# Patient Record
Sex: Female | Born: 1940 | Race: Black or African American | Hispanic: No | Marital: Married | Smoking: Never smoker
Health system: Southern US, Community
[De-identification: ages and names within clinical notes are randomized; demographics above are authoritative.]

## PROBLEM LIST (undated history)

## (undated) DIAGNOSIS — C801 Malignant (primary) neoplasm, unspecified: Secondary | ICD-10-CM

## (undated) DIAGNOSIS — I10 Essential (primary) hypertension: Secondary | ICD-10-CM

## (undated) DIAGNOSIS — M1990 Unspecified osteoarthritis, unspecified site: Secondary | ICD-10-CM

## (undated) DIAGNOSIS — H409 Unspecified glaucoma: Secondary | ICD-10-CM

## (undated) DIAGNOSIS — Z923 Personal history of irradiation: Secondary | ICD-10-CM

## (undated) HISTORY — PX: ENDOMETRIAL BIOPSY: SHX622

## (undated) HISTORY — PX: EYE SURGERY: SHX253

## (undated) HISTORY — DX: Unspecified glaucoma: H40.9

---

## 2004-06-22 ENCOUNTER — Ambulatory Visit: Payer: Self-pay | Admitting: General Practice

## 2005-08-17 ENCOUNTER — Ambulatory Visit: Payer: Self-pay | Admitting: General Practice

## 2006-07-31 ENCOUNTER — Ambulatory Visit (HOSPITAL_COMMUNITY): Admission: RE | Admit: 2006-07-31 | Discharge: 2006-08-01 | Payer: Self-pay | Admitting: Ophthalmology

## 2006-08-20 ENCOUNTER — Ambulatory Visit: Payer: Self-pay | Admitting: Endocrinology

## 2006-09-24 ENCOUNTER — Ambulatory Visit: Payer: Self-pay | Admitting: Gastroenterology

## 2007-09-05 ENCOUNTER — Ambulatory Visit: Payer: Self-pay | Admitting: Obstetrics and Gynecology

## 2008-10-07 ENCOUNTER — Ambulatory Visit: Payer: Self-pay | Admitting: Obstetrics and Gynecology

## 2009-10-14 ENCOUNTER — Ambulatory Visit: Payer: Self-pay | Admitting: Obstetrics and Gynecology

## 2010-06-14 NOTE — Op Note (Signed)
Joanne Collier, Joanne Collier                    ACCOUNT NO.:  [id number]   MEDICAL RECORD NO.:  [id number]          PATIENT TYPE:  AMB   LOCATION:  SDS                          FACILITY:  [facility]   PHYSICIAN:  John D. Ashley Royalty, M.D. 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   PROCEDURE:  1. Pars plana vitrectomy.  2. Membrane peel.  3. Internal limiting membrane peel.  4. Retinal photocoagulation, left eye.   SURGEON:  Beulah Gandy. Ashley Royalty, M.D.   ASSISTANT:  Rosalie Doctor.   ANESTHESIA:  General.   DETAILS:  Usual prep and drape, the indirect ophthalmoscope laser was  moved into place, 536 burns were placed with the indirect ophthalmoscope  laser power 1,000 milliwatts, 1,000 microns each and 0.1 seconds each,  two rows were placed in the far periphery.   The attention was carried to the pars plana area where conjunctival  peritomies were made at 10, 2 and 4 o'clock.  The sclerotomies were made  at the same locations.  The 5-mm infusion port was anchored into place  at 4 o'clock.  The lighted thick endo cutter were placed at 10 and 2  o'clock respectively.  Contact lens ring was anchored into place at 6  and 12 o'clock, Provisc placed on the corneal surface and the flat  contact lens placed.  The pars plana vitrectomy was carried out just  behind the cataract lens.  Some blood and vitreous elements were seen.  These were carefully removed under low suction and rapid cutting.  Some  of the vitreous areas were white and in a sheet.  These were carefully  removed.  Attention was carried to the macular region where the MVR  sharp blade was used to engage and incise the cellular preretinal  membrane and the internal limiting membrane.  The ILM forceps were then  used to grasp the membrane and peel it around the macular region,  removing the entire  membrane in 2 sheets.  These membranes were removed  from the eye.  The vitrectomy was continued out into the far periphery  with the BIOM viewing system.  All vitreous was trimmed down to the  vitreous base for 360 degrees.  A washout procedure was performed.  The  instruments were removed from the eye, 9-0 nylon was used to close the  sclerotomy sites.  They were tested and found to be tight.  The  conjunctiva was closed with wet field cautery.  Polymyxin and gentamicin  were irrigated into [place] space.  Marcaine was injected around the globe  for postop pain.  Decadron 10 mg was injected into the lower  subconjunctival space.  TobraDex ophthalmic ointment, and a patch and  shield were placed.  The closing pressure was 15 with the [place]  tonometer.   COMPLICATIONS:  None.   DURATION:  45 minutes.   The patient was awakened and taken to recovery in satisfactory  condition.  Beulah Gandy. Ashley Royalty, M.D.  Electronically Signed     JDM/MEDQ  D:  07/31/2006  T:  07/31/2006  Job:  [id number]

## 2010-11-01 ENCOUNTER — Encounter (INDEPENDENT_AMBULATORY_CARE_PROVIDER_SITE_OTHER): Payer: Medicare Other | Admitting: Ophthalmology

## 2010-11-01 DIAGNOSIS — H3530 Unspecified macular degeneration: Secondary | ICD-10-CM

## 2010-11-01 DIAGNOSIS — H43819 Vitreous degeneration, unspecified eye: Secondary | ICD-10-CM

## 2010-11-01 DIAGNOSIS — H35379 Puckering of macula, unspecified eye: Secondary | ICD-10-CM

## 2010-11-01 DIAGNOSIS — H35039 Hypertensive retinopathy, unspecified eye: Secondary | ICD-10-CM

## 2010-11-08 ENCOUNTER — Ambulatory Visit: Payer: Self-pay | Admitting: Obstetrics and Gynecology

## 2010-11-15 LAB — BASIC METABOLIC PANEL
BUN: 13
Calcium: 9.5
Creatinine, Ser: 0.72
GFR calc non Af Amer: >60
Glucose, Bld: 102 — ABNORMAL HIGH
Potassium: 4.1

## 2010-11-15 LAB — CBC
HCT: 34.8 — ABNORMAL LOW
Platelets: 207
RDW: 13.4

## 2011-08-01 ENCOUNTER — Encounter (INDEPENDENT_AMBULATORY_CARE_PROVIDER_SITE_OTHER): Payer: Medicare Other | Admitting: Ophthalmology

## 2011-08-01 DIAGNOSIS — H43819 Vitreous degeneration, unspecified eye: Secondary | ICD-10-CM

## 2011-08-01 DIAGNOSIS — H4010X0 Unspecified open-angle glaucoma, stage unspecified: Secondary | ICD-10-CM

## 2011-08-01 DIAGNOSIS — H35039 Hypertensive retinopathy, unspecified eye: Secondary | ICD-10-CM

## 2011-08-01 DIAGNOSIS — I10 Essential (primary) hypertension: Secondary | ICD-10-CM

## 2011-08-01 DIAGNOSIS — H35379 Puckering of macula, unspecified eye: Secondary | ICD-10-CM

## 2011-08-01 DIAGNOSIS — H3530 Unspecified macular degeneration: Secondary | ICD-10-CM

## 2011-11-09 ENCOUNTER — Ambulatory Visit: Payer: Self-pay | Admitting: Obstetrics and Gynecology

## 2012-05-01 ENCOUNTER — Ambulatory Visit (INDEPENDENT_AMBULATORY_CARE_PROVIDER_SITE_OTHER): Payer: Medicare Other | Admitting: Ophthalmology

## 2012-05-01 DIAGNOSIS — H35379 Puckering of macula, unspecified eye: Secondary | ICD-10-CM

## 2012-05-01 DIAGNOSIS — I10 Essential (primary) hypertension: Secondary | ICD-10-CM

## 2012-05-01 DIAGNOSIS — H35359 Cystoid macular degeneration, unspecified eye: Secondary | ICD-10-CM

## 2012-05-01 DIAGNOSIS — H35039 Hypertensive retinopathy, unspecified eye: Secondary | ICD-10-CM

## 2012-06-12 ENCOUNTER — Encounter (INDEPENDENT_AMBULATORY_CARE_PROVIDER_SITE_OTHER): Payer: Medicare Other | Admitting: Ophthalmology

## 2012-06-12 DIAGNOSIS — I10 Essential (primary) hypertension: Secondary | ICD-10-CM

## 2012-06-12 DIAGNOSIS — H35039 Hypertensive retinopathy, unspecified eye: Secondary | ICD-10-CM

## 2012-06-12 DIAGNOSIS — H35359 Cystoid macular degeneration, unspecified eye: Secondary | ICD-10-CM

## 2012-06-12 DIAGNOSIS — H43819 Vitreous degeneration, unspecified eye: Secondary | ICD-10-CM

## 2012-06-12 DIAGNOSIS — H35379 Puckering of macula, unspecified eye: Secondary | ICD-10-CM

## 2012-09-18 ENCOUNTER — Encounter (INDEPENDENT_AMBULATORY_CARE_PROVIDER_SITE_OTHER): Payer: Medicare Other | Admitting: Ophthalmology

## 2012-09-18 DIAGNOSIS — H43819 Vitreous degeneration, unspecified eye: Secondary | ICD-10-CM

## 2012-09-18 DIAGNOSIS — H35379 Puckering of macula, unspecified eye: Secondary | ICD-10-CM

## 2012-09-18 DIAGNOSIS — I10 Essential (primary) hypertension: Secondary | ICD-10-CM

## 2012-09-18 DIAGNOSIS — H35039 Hypertensive retinopathy, unspecified eye: Secondary | ICD-10-CM

## 2012-09-18 DIAGNOSIS — H35359 Cystoid macular degeneration, unspecified eye: Secondary | ICD-10-CM

## 2012-12-19 ENCOUNTER — Ambulatory Visit (INDEPENDENT_AMBULATORY_CARE_PROVIDER_SITE_OTHER): Payer: Medicare Other | Admitting: Ophthalmology

## 2012-12-19 DIAGNOSIS — H35379 Puckering of macula, unspecified eye: Secondary | ICD-10-CM

## 2012-12-19 DIAGNOSIS — H43819 Vitreous degeneration, unspecified eye: Secondary | ICD-10-CM

## 2012-12-19 DIAGNOSIS — H2510 Age-related nuclear cataract, unspecified eye: Secondary | ICD-10-CM

## 2012-12-19 DIAGNOSIS — H35039 Hypertensive retinopathy, unspecified eye: Secondary | ICD-10-CM

## 2012-12-19 DIAGNOSIS — I10 Essential (primary) hypertension: Secondary | ICD-10-CM

## 2012-12-19 DIAGNOSIS — H35359 Cystoid macular degeneration, unspecified eye: Secondary | ICD-10-CM

## 2013-04-21 ENCOUNTER — Ambulatory Visit (INDEPENDENT_AMBULATORY_CARE_PROVIDER_SITE_OTHER): Payer: Medicare Other | Admitting: Ophthalmology

## 2013-04-21 DIAGNOSIS — H35039 Hypertensive retinopathy, unspecified eye: Secondary | ICD-10-CM

## 2013-04-21 DIAGNOSIS — H43819 Vitreous degeneration, unspecified eye: Secondary | ICD-10-CM

## 2013-04-21 DIAGNOSIS — H2510 Age-related nuclear cataract, unspecified eye: Secondary | ICD-10-CM

## 2013-04-21 DIAGNOSIS — I10 Essential (primary) hypertension: Secondary | ICD-10-CM

## 2013-04-21 DIAGNOSIS — H35359 Cystoid macular degeneration, unspecified eye: Secondary | ICD-10-CM

## 2013-04-21 DIAGNOSIS — H35379 Puckering of macula, unspecified eye: Secondary | ICD-10-CM

## 2013-10-22 ENCOUNTER — Ambulatory Visit (INDEPENDENT_AMBULATORY_CARE_PROVIDER_SITE_OTHER): Payer: Medicare Other | Admitting: Ophthalmology

## 2013-10-22 DIAGNOSIS — I10 Essential (primary) hypertension: Secondary | ICD-10-CM

## 2013-10-22 DIAGNOSIS — H43819 Vitreous degeneration, unspecified eye: Secondary | ICD-10-CM | POA: Diagnosis not present

## 2013-10-22 DIAGNOSIS — H35359 Cystoid macular degeneration, unspecified eye: Secondary | ICD-10-CM

## 2013-10-22 DIAGNOSIS — H35039 Hypertensive retinopathy, unspecified eye: Secondary | ICD-10-CM

## 2013-11-28 ENCOUNTER — Ambulatory Visit: Payer: Self-pay | Admitting: Obstetrics and Gynecology

## 2014-04-29 ENCOUNTER — Ambulatory Visit (INDEPENDENT_AMBULATORY_CARE_PROVIDER_SITE_OTHER): Payer: Medicare Other | Admitting: Ophthalmology

## 2014-04-30 ENCOUNTER — Ambulatory Visit (INDEPENDENT_AMBULATORY_CARE_PROVIDER_SITE_OTHER): Payer: Medicare Other | Admitting: Ophthalmology

## 2014-05-06 ENCOUNTER — Ambulatory Visit (INDEPENDENT_AMBULATORY_CARE_PROVIDER_SITE_OTHER): Payer: Medicare Other | Admitting: Ophthalmology

## 2014-05-06 DIAGNOSIS — H59031 Cystoid macular edema following cataract surgery, right eye: Secondary | ICD-10-CM

## 2014-05-06 DIAGNOSIS — H43811 Vitreous degeneration, right eye: Secondary | ICD-10-CM

## 2014-05-06 DIAGNOSIS — H35033 Hypertensive retinopathy, bilateral: Secondary | ICD-10-CM

## 2014-05-06 DIAGNOSIS — I10 Essential (primary) hypertension: Secondary | ICD-10-CM

## 2014-05-06 DIAGNOSIS — H35373 Puckering of macula, bilateral: Secondary | ICD-10-CM | POA: Diagnosis not present

## 2014-05-06 DIAGNOSIS — H2511 Age-related nuclear cataract, right eye: Secondary | ICD-10-CM | POA: Diagnosis not present

## 2014-11-17 ENCOUNTER — Other Ambulatory Visit: Payer: Self-pay | Admitting: Obstetrics and Gynecology

## 2014-11-17 DIAGNOSIS — Z1231 Encounter for screening mammogram for malignant neoplasm of breast: Secondary | ICD-10-CM

## 2014-12-02 ENCOUNTER — Other Ambulatory Visit: Payer: Self-pay | Admitting: Obstetrics and Gynecology

## 2014-12-02 ENCOUNTER — Ambulatory Visit
Admission: RE | Admit: 2014-12-02 | Discharge: 2014-12-02 | Disposition: A | Discharge status: Home / Self Care | Payer: Medicare Other | Source: Ambulatory Visit | Attending: Obstetrics and Gynecology | Admitting: Obstetrics and Gynecology

## 2014-12-02 DIAGNOSIS — Z1231 Encounter for screening mammogram for malignant neoplasm of breast: Secondary | ICD-10-CM

## 2015-02-08 ENCOUNTER — Ambulatory Visit (INDEPENDENT_AMBULATORY_CARE_PROVIDER_SITE_OTHER): Payer: Medicare Other | Admitting: Ophthalmology

## 2015-03-31 ENCOUNTER — Ambulatory Visit (INDEPENDENT_AMBULATORY_CARE_PROVIDER_SITE_OTHER): Payer: Medicare Other | Admitting: Ophthalmology

## 2015-04-12 ENCOUNTER — Ambulatory Visit (INDEPENDENT_AMBULATORY_CARE_PROVIDER_SITE_OTHER): Payer: Medicare Other | Admitting: Ophthalmology

## 2015-04-12 DIAGNOSIS — H35033 Hypertensive retinopathy, bilateral: Secondary | ICD-10-CM | POA: Diagnosis not present

## 2015-04-12 DIAGNOSIS — H2511 Age-related nuclear cataract, right eye: Secondary | ICD-10-CM

## 2015-04-12 DIAGNOSIS — H35373 Puckering of macula, bilateral: Secondary | ICD-10-CM | POA: Diagnosis not present

## 2015-04-12 DIAGNOSIS — H43813 Vitreous degeneration, bilateral: Secondary | ICD-10-CM | POA: Diagnosis not present

## 2015-04-12 DIAGNOSIS — I10 Essential (primary) hypertension: Secondary | ICD-10-CM | POA: Diagnosis not present

## 2015-11-26 ENCOUNTER — Other Ambulatory Visit: Payer: Self-pay | Admitting: Obstetrics and Gynecology

## 2015-11-26 DIAGNOSIS — Z1231 Encounter for screening mammogram for malignant neoplasm of breast: Secondary | ICD-10-CM

## 2015-12-31 ENCOUNTER — Ambulatory Visit
Admission: RE | Admit: 2015-12-31 | Discharge: 2015-12-31 | Disposition: A | Discharge status: Home / Self Care | Payer: Medicare Other | Source: Ambulatory Visit | Attending: Obstetrics and Gynecology | Admitting: Obstetrics and Gynecology

## 2015-12-31 DIAGNOSIS — Z1231 Encounter for screening mammogram for malignant neoplasm of breast: Secondary | ICD-10-CM | POA: Diagnosis present

## 2016-01-12 ENCOUNTER — Ambulatory Visit (INDEPENDENT_AMBULATORY_CARE_PROVIDER_SITE_OTHER): Payer: Medicare Other | Admitting: Ophthalmology

## 2016-01-12 DIAGNOSIS — H43811 Vitreous degeneration, right eye: Secondary | ICD-10-CM | POA: Diagnosis not present

## 2016-01-12 DIAGNOSIS — H35033 Hypertensive retinopathy, bilateral: Secondary | ICD-10-CM

## 2016-01-12 DIAGNOSIS — H35373 Puckering of macula, bilateral: Secondary | ICD-10-CM

## 2016-01-12 DIAGNOSIS — H2511 Age-related nuclear cataract, right eye: Secondary | ICD-10-CM

## 2016-01-12 DIAGNOSIS — I10 Essential (primary) hypertension: Secondary | ICD-10-CM | POA: Diagnosis not present

## 2016-10-13 ENCOUNTER — Ambulatory Visit (INDEPENDENT_AMBULATORY_CARE_PROVIDER_SITE_OTHER): Payer: Medicare Other | Admitting: Ophthalmology

## 2016-11-02 ENCOUNTER — Encounter (INDEPENDENT_AMBULATORY_CARE_PROVIDER_SITE_OTHER): Payer: Medicare Other | Admitting: Ophthalmology

## 2016-11-02 DIAGNOSIS — H35033 Hypertensive retinopathy, bilateral: Secondary | ICD-10-CM

## 2016-11-02 DIAGNOSIS — H35373 Puckering of macula, bilateral: Secondary | ICD-10-CM

## 2016-11-02 DIAGNOSIS — I10 Essential (primary) hypertension: Secondary | ICD-10-CM | POA: Diagnosis not present

## 2016-11-02 DIAGNOSIS — H43811 Vitreous degeneration, right eye: Secondary | ICD-10-CM

## 2016-11-28 ENCOUNTER — Other Ambulatory Visit: Payer: Self-pay | Admitting: Obstetrics and Gynecology

## 2016-11-28 DIAGNOSIS — N8111 Cystocele, midline: Secondary | ICD-10-CM | POA: Insufficient documentation

## 2016-11-28 DIAGNOSIS — Z1231 Encounter for screening mammogram for malignant neoplasm of breast: Secondary | ICD-10-CM

## 2017-01-02 ENCOUNTER — Ambulatory Visit
Admission: RE | Admit: 2017-01-02 | Discharge: 2017-01-02 | Disposition: A | Discharge status: Home / Self Care | Payer: Medicare Other | Source: Ambulatory Visit | Attending: Obstetrics and Gynecology | Admitting: Obstetrics and Gynecology

## 2017-01-02 DIAGNOSIS — Z1231 Encounter for screening mammogram for malignant neoplasm of breast: Secondary | ICD-10-CM | POA: Insufficient documentation

## 2017-01-03 ENCOUNTER — Other Ambulatory Visit: Payer: Self-pay | Admitting: Obstetrics and Gynecology

## 2017-01-03 DIAGNOSIS — R928 Other abnormal and inconclusive findings on diagnostic imaging of breast: Secondary | ICD-10-CM

## 2017-01-03 DIAGNOSIS — N6320 Unspecified lump in the left breast, unspecified quadrant: Secondary | ICD-10-CM

## 2017-01-11 ENCOUNTER — Ambulatory Visit
Admission: RE | Admit: 2017-01-11 | Discharge: 2017-01-11 | Disposition: A | Discharge status: Home / Self Care | Payer: Medicare Other | Source: Ambulatory Visit | Attending: Obstetrics and Gynecology | Admitting: Obstetrics and Gynecology

## 2017-01-11 DIAGNOSIS — N6321 Unspecified lump in the left breast, upper outer quadrant: Secondary | ICD-10-CM | POA: Insufficient documentation

## 2017-01-11 DIAGNOSIS — N6320 Unspecified lump in the left breast, unspecified quadrant: Secondary | ICD-10-CM

## 2017-01-11 DIAGNOSIS — N6322 Unspecified lump in the left breast, upper inner quadrant: Secondary | ICD-10-CM | POA: Insufficient documentation

## 2017-01-11 DIAGNOSIS — R928 Other abnormal and inconclusive findings on diagnostic imaging of breast: Secondary | ICD-10-CM

## 2017-01-15 ENCOUNTER — Other Ambulatory Visit: Payer: Self-pay | Admitting: Obstetrics and Gynecology

## 2017-01-15 DIAGNOSIS — N6320 Unspecified lump in the left breast, unspecified quadrant: Secondary | ICD-10-CM

## 2017-01-15 DIAGNOSIS — R928 Other abnormal and inconclusive findings on diagnostic imaging of breast: Secondary | ICD-10-CM

## 2017-01-30 DIAGNOSIS — C50919 Malignant neoplasm of unspecified site of unspecified female breast: Secondary | ICD-10-CM

## 2017-01-30 HISTORY — DX: Malignant neoplasm of unspecified site of unspecified female breast: C50.919

## 2017-02-01 ENCOUNTER — Ambulatory Visit
Admission: RE | Admit: 2017-02-01 | Discharge: 2017-02-01 | Disposition: A | Discharge status: Home / Self Care | Payer: Medicare Other | Source: Ambulatory Visit | Attending: Obstetrics and Gynecology | Admitting: Obstetrics and Gynecology

## 2017-02-01 DIAGNOSIS — Z170 Estrogen receptor positive status [ER+]: Secondary | ICD-10-CM

## 2017-02-01 DIAGNOSIS — C50412 Malignant neoplasm of upper-outer quadrant of left female breast: Secondary | ICD-10-CM | POA: Diagnosis not present

## 2017-02-01 DIAGNOSIS — R928 Other abnormal and inconclusive findings on diagnostic imaging of breast: Secondary | ICD-10-CM

## 2017-02-01 DIAGNOSIS — C50212 Malignant neoplasm of upper-inner quadrant of left female breast: Secondary | ICD-10-CM | POA: Insufficient documentation

## 2017-02-01 DIAGNOSIS — N6320 Unspecified lump in the left breast, unspecified quadrant: Secondary | ICD-10-CM

## 2017-02-01 HISTORY — PX: BREAST BIOPSY: SHX20

## 2017-02-02 ENCOUNTER — Encounter: Payer: Self-pay | Admitting: *Deleted

## 2017-02-02 NOTE — Progress Notes (Signed)
  Oncology Nurse Navigator Documentation  Navigator Location: [facility] Onc (02/02/17 1500) Referral date to RadOnc/MedOnc: 02/08/17 (02/02/17 1500) )Navigator Encounter Type: Introductory phone call (02/02/17 1500)   Abnormal Finding Date: 01/11/17 (02/02/17 1500) Confirmed Diagnosis Date: 02/02/17 (02/02/17 1500)                   Barriers/Navigation Needs: Education;Coordination of Care (02/02/17 1500)   Interventions: Coordination of Care (02/02/17 1500)   Coordination of Care: Appts (02/02/17 1500)                  Time Spent with Patient: 45 (02/02/17 1500)   Called patient to establish navigation services.  Patient is newly diagnosed with left invasive breast cancer.  Per Dr. Tonette Bihari request patient will be scheduled for surgical consult with Dr. Tamala Julian. His office is closed this afternoon, so I will schedule her appointment on Monday.  Patient does not have a preference as to a Psychologist, sport and exercise or medical oncologist.  I have scheduled her to see Dr. Mike Gip on 01/1017 @ 3:45.  She is to bring a photo ID and all her meds.  She is to call if she has any questions or needs.  I will give her her educational literature at her appointment.

## 2017-02-05 ENCOUNTER — Encounter: Payer: Self-pay | Admitting: *Deleted

## 2017-02-05 NOTE — Progress Notes (Signed)
  Oncology Nurse Navigator Documentation  Navigator Location: [facility] (02/05/17 0800)   )Navigator Encounter Type: Telephone (02/05/17 0800) Telephone: Lahoma Crocker Call (02/05/17 0800)                       Barriers/Navigation Needs: Coordination of Care (02/05/17 0800)   Interventions: Coordination of Care (02/05/17 0800)   Coordination of Care: Appts (02/05/17 0800)                  Time Spent with Patient: 15 (02/05/17 0800)   Called patient and informed her of her appointment with Dr. Tamala Julian on 02/06/17.  Sherry at his office is to give the patient her educational literature, "My Breast Cancer Treatment Handbook" by Josephine Igo, RN.  Will meet patient at her medical oncology consult with Dr. Mike Gip.

## 2017-02-06 ENCOUNTER — Other Ambulatory Visit: Payer: Self-pay | Admitting: Surgery

## 2017-02-06 DIAGNOSIS — C50412 Malignant neoplasm of upper-outer quadrant of left female breast: Secondary | ICD-10-CM

## 2017-02-06 LAB — SURGICAL PATHOLOGY

## 2017-02-07 ENCOUNTER — Other Ambulatory Visit: Payer: Self-pay | Admitting: Surgery

## 2017-02-07 DIAGNOSIS — C50412 Malignant neoplasm of upper-outer quadrant of left female breast: Secondary | ICD-10-CM

## 2017-02-08 ENCOUNTER — Inpatient Hospital Stay: Payer: Medicare Other

## 2017-02-08 ENCOUNTER — Encounter: Payer: Self-pay | Admitting: *Deleted

## 2017-02-08 ENCOUNTER — Other Ambulatory Visit: Payer: Self-pay

## 2017-02-08 ENCOUNTER — Encounter: Payer: Self-pay | Admitting: Hematology and Oncology

## 2017-02-08 ENCOUNTER — Inpatient Hospital Stay: Payer: Medicare Other | Attending: Hematology and Oncology | Admitting: Hematology and Oncology

## 2017-02-08 DIAGNOSIS — Z7982 Long term (current) use of aspirin: Secondary | ICD-10-CM | POA: Diagnosis not present

## 2017-02-08 DIAGNOSIS — Z171 Estrogen receptor negative status [ER-]: Secondary | ICD-10-CM | POA: Insufficient documentation

## 2017-02-08 DIAGNOSIS — C50212 Malignant neoplasm of upper-inner quadrant of left female breast: Secondary | ICD-10-CM

## 2017-02-08 DIAGNOSIS — Z8041 Family history of malignant neoplasm of ovary: Secondary | ICD-10-CM | POA: Diagnosis not present

## 2017-02-08 DIAGNOSIS — H409 Unspecified glaucoma: Secondary | ICD-10-CM | POA: Diagnosis not present

## 2017-02-08 DIAGNOSIS — C50112 Malignant neoplasm of central portion of left female breast: Secondary | ICD-10-CM | POA: Insufficient documentation

## 2017-02-08 DIAGNOSIS — Z800 Family history of malignant neoplasm of digestive organs: Secondary | ICD-10-CM | POA: Diagnosis not present

## 2017-02-08 DIAGNOSIS — Z79899 Other long term (current) drug therapy: Secondary | ICD-10-CM | POA: Diagnosis not present

## 2017-02-08 LAB — COMPREHENSIVE METABOLIC PANEL
ALT: 11 U/L — ABNORMAL LOW (ref 14–54)
AST: 18 U/L (ref 15–41)
Albumin: 3.6 g/dL (ref 3.5–5.0)
Alkaline Phosphatase: 102 U/L (ref 38–126)
Anion gap: 7 (ref 5–15)
BUN: 15 mg/dL (ref 6–20)
CO2: 28 mmol/L (ref 22–32)
Calcium: 9.1 mg/dL (ref 8.9–10.3)
Chloride: 104 mmol/L (ref 101–111)
Creatinine, Ser: 0.72 mg/dL (ref 0.44–1.00)
GFR calc Af Amer: >60 mL/min (ref >60)
GFR calc non Af Amer: >60 mL/min (ref >60)
Glucose, Bld: 97 mg/dL (ref 65–99)
Potassium: 3.7 mmol/L (ref 3.5–5.1)
Sodium: 139 mmol/L (ref 135–145)
Total Bilirubin: 0.5 mg/dL (ref 0.3–1.2)
Total Protein: 8.4 g/dL — ABNORMAL HIGH (ref 6.5–8.1)

## 2017-02-08 LAB — CBC WITH DIFFERENTIAL/PLATELET
Basophils Absolute: 0 10*3/uL (ref 0–0.1)
Basophils Relative: 1 %
Eosinophils Absolute: 0.1 10*3/uL (ref 0–0.7)
Eosinophils Relative: 1 %
HCT: 34.8 % — ABNORMAL LOW (ref 35.0–47.0)
Hemoglobin: 11.6 g/dL — ABNORMAL LOW (ref 12.0–16.0)
Lymphocytes Relative: 22 %
Lymphs Abs: 1.2 10*3/uL (ref 1.0–3.6)
MCH: 30.5 pg (ref 26.0–34.0)
MCHC: 33.4 g/dL (ref 32.0–36.0)
MCV: 91.4 fL (ref 80.0–100.0)
Monocytes Absolute: 0.4 10*3/uL (ref 0.2–0.9)
Monocytes Relative: 7 %
Neutro Abs: 4 10*3/uL (ref 1.4–6.5)
Neutrophils Relative %: 69 %
Platelets: 214 10*3/uL (ref 150–440)
RBC: 3.81 MIL/uL (ref 3.80–5.20)
RDW: 13.7 % (ref 11.5–14.5)
WBC: 5.8 10*3/uL (ref 3.6–11.0)

## 2017-02-08 NOTE — Progress Notes (Signed)
[facility] day:  02/08/2017  Chief Complaint: Joanne Collier is a 77 y.o. female with left breast cancer who is referred in consultation by Dr. Laverta Collier for assessment and management.  HPI:   The patient has annual mammograms.  Bilateral screening mammogram on 01/02/2017 revealed a possible mass in the left breast.  Diagnostic left mammogram and ultrasound on 01/11/2017 revealed an indeterminate solid nodule in the 12 o'clock location of the left breast.  Mammogram revealed a small mass with indistinct margins in the upper central portion of the left breast.  Targeted ultrasound showed a 0.6 x 0.4 x 0.4 cm solid nodule in the 12 o'clock location of the left breast 6 cm from the nipple. Some margins were angular.  There was no associated internal vascularity. There was neither posterior acoustic enhancement or shadowing. Evaluation of the axilla was negative.  Exam revealed no palpable mass.  Left breast biopsy with clip placement on 02/01/2017 revealed invasive mammary carcinoma, no special type.  Biopsy was 4 mm and grade III.  There was no DCIS or lymphovascular invasion.  Tumor was ER- , PR positive (11-50%), and Her2/neu 1+.  She was seen by Dr. Rochel Collier on 02/06/2017.  She is scheduled for partial mastectomy on 02/23/2017.  She has menses at age 71.  She had 4 pregnancies.  She did not breast feed her children.  She was on birth control pills for several years beginning in 1973.  She had menopause in her late 31s.  Her mother had ovarian cancer at age 50.  He brother had colon cancer at age 25.   Symptomatically, she feels good.  She denies any symptoms.   Past Medical History:  Diagnosis Date  . Glaucoma     History reviewed. No pertinent surgical history.  Family History  Problem Relation Age of Onset  . Cancer Mother 23       ovarian  . Cancer Brother 56       colon  . Breast cancer Neg Hx     Social History:  reports  that  has never smoked. she has never used smokeless tobacco. She reports that she does not drink alcohol or use drugs.  She is Lexicographer.  She worked in [employer].  She denies any exposure to radiation or toxins.  She has 3 children ages 73, 42, and 91.  She had an infant who died at 31 months.  She lives in [place].  The patient is accompanied by her husband, Joanne Collier, today.  Allergies: No Known Allergies  Current Medications: Current Outpatient Medications  Medication Sig Dispense Refill  . aspirin EC 81 MG tablet Take 81 mg by mouth daily.    Marland Kitchen atenolol (TENORMIN) 25 MG tablet Take 25 mg by mouth daily.    . dorzolamide (TRUSOPT) 2 % ophthalmic solution Place 1 drop into both eyes 2 (two) times daily.    Marland Kitchen ibuprofen (ADVIL,MOTRIN) 200 MG tablet Take 200 mg by mouth every 6 (six) hours as needed for headache or moderate pain.    . TRAVATAN Z 0.004 % SOLN ophthalmic solution Place 1 drop into the right eye at bedtime.  1   No current facility-administered medications for this visit.     Review of Systems:  GENERAL:  Feels good.  No fevers, sweats or weight loss. PERFORMANCE STATUS (ECOG):  0 HEENT:  s/p left eye surgery (2008) and right cataract surgery (2018).  No visual changes, runny  nose, sore throat, mouth sores or tenderness. Lungs: No shortness of breath or cough.  No hemoptysis. Cardiac:  No chest pain, palpitations, orthopnea, or PND. GI:  No nausea, vomiting, diarrhea, constipation, melena or hematochezia. GU:  No urgency, frequency, dysuria, or hematuria. Musculoskeletal:  No back pain.  No joint pain.  No muscle tenderness. Extremities:  No pain or swelling. Skin:  No rashes or skin changes. Neuro:  No headache, numbness or weakness, balance or coordination issues. Endocrine:  No diabetes, thyroid issues, hot flashes or night sweats. Psych:  No mood changes, depression or anxiety. Pain:  No focal pain. Review of systems:  All other systems reviewed and found to  be negative.  Physical Exam: Blood pressure 126/79, pulse 62, temperature 98 F (36.7 C), temperature source Tympanic, resp. rate 16, height 5' 6" (1.676 m), weight 166 lb 4.8 oz (75.4 kg). GENERAL:  Well developed, well nourished, woman sitting comfortably in the exam room in no acute distress. MENTAL STATUS:  Alert and oriented to person, place and time. HEAD:  Pearline Cables hair.  Normocephalic, atraumatic, face symmetric, no Cushingoid features. EYES:  Glasses.  Brown eyes.  s/p caatract surgery.  Pupils equal round and reactive to light and accomodation.  No conjunctivitis or scleral icterus. ENT:  Oropharynx clear without lesion.  Tongue normal. Mucous membranes moist.  RESPIRATORY:  Clear to auscultation without rales, wheezes or rhonchi. CARDIOVASCULAR:  Regular rate and rhythm without murmur, rub or gallop. BREAST:  Right breast without masses, skin changes or nipple discharge.  Left breast s/p biopsy with an area of ecchymosis and 2-3 cm hematoma superior to nipple.  No skin changes or nipple discharge.  ABDOMEN:  Soft, non-tender, with active bowel sounds, and no hepatosplenomegaly.  No masses. SKIN:  No rashes, ulcers or lesions. EXTREMITIES: No edema, no skin discoloration or tenderness.  No palpable cords. LYMPH NODES: No palpable cervical, supraclavicular, axillary or inguinal adenopathy  NEUROLOGICAL: Unremarkable. PSYCH:  Appropriate.   No visits with results within 3 Day(s) from this visit.  Latest known visit with results is:  Hospital Outpatient Visit on 02/01/2017  Component Date Value Ref Range Status  . SURGICAL PATHOLOGY 02/01/2017    Final-Edited                   Value:Surgical Pathology THIS IS AN ADDENDUM REPORT CASE: [id number] PATIENT: Joanne Collier Surgical Pathology Report Addendum  Reason for Addendum #1:  Immunohistochemistry results  SPECIMEN SUBMITTED: A. Breast, left, 12:00  CLINICAL HISTORY: Small solid nodule in the 12 o'clock location of the left  breast 6 cm from the nipple which measures 0.6 x 0.4 x 0.4 cm  PRE-OPERATIVE DIAGNOSIS: Concern for malignancy  POST-OPERATIVE DIAGNOSIS: Same as pre-op     DIAGNOSIS: A. BREAST, LEFT 12:00; ULTRASOUND GUIDED BIOPSY: - INVASIVE MAMMARY CARCINOMA, NO SPECIAL TYPE.  Size of invasive carcinoma: 4 mm in this sample Histologic grade of invasive carcinoma: overall grade 3      Glandular/tubular differentiation score: 3      Nuclear pleomorphism score: 3      Mitotic rate score: 3      Total score: 3  Ductal carcinoma in situ: Not identified Lymphovascular invasion: Not identified ER/PR/HER2: Immunohistochemistry will be performed on b                         lock A1, with reflex to [place] for HER2 2+. The results will be reported in an addendum.  Comment: The  definitive grade will be assigned on the excisional specimen. These findings were communicated to Curtice in Dr. Terrilyn Saver office on 02/02/2017. Read back procedure was performed.     GROSS DESCRIPTION:  The specimen is received in a formalin-filled container labeled with the patient's name and ultrasound-guided left breast biopsy 12:00, 6 cm from nipple.  Core pieces: 4 Measurement: 1.1-1.9 cm in length and 0.2 cm in diameter Comments: yellow-red lobulated fibrofatty, marked blue  Entirely submitted in cassette(s): 1  Time/Date in fixative: collected and placed in formalin at 8:42 AM on 02/01/2017 Total fixation time: 9 hours    Final Diagnosis performed by Quay Burow, MD.  Electronically signed 02/02/2017 11:13:04AM   The electronic signature indicates that the named Attending Pathologist has evaluated the specimen  Technical component performed at [facility], [address], [place], [place] [address] Lab: [phone] Dir: Rush Farmer, MD, MMM  Professional component performed at [facility], [facility], [place], [place], [place] [address] Lab:  [phone] Dir: Dellia Nims. Rubinas, MD   Breast Biomarker Reporting Template  BREAST BIOMARKER TESTS Estrogen Receptor (ER) Status: Negative Background internal control stains as expected Progesterone Receptor (PgR) Status: Positive, range 11-50% of nuclei      Average intensity of staining: Moderate HER2 (by immunohistochemistry): Negative (1+)  METHODS Cold Ischemia and Fixation Times: Meet requirements specified in latest version of the ASCO/CAP guidelines Testing Performed on Block Number(s): A1 Fixative: Formalin Estrogen Receptor:  FDA cleared ([place])                    Primary Antibody:  SP1 Progesterone Receptor: FDA cleared ([place])                   Primary Antibody: 1E2 HER2 (by immunohistochemistry): FDA approved ([place])                                                      Primary Antibody: HercepTest  Immunohistochemistry controls worked appropriately.  Immunohistochemistry controls worked appropriately. Slides were prepared by [facility], [place], [place], and interpreted by [facility] value].      Addendum #1 performed by Quay Burow, MD.  Electronically signed 02/06/2017 1:50:03PM    Technical component performed at [facility], [address], [place], [place] [address] Lab: [phone] Dir: Rush Farmer, MD, MMM  Professional component performed at [facility], [place], [place], [place], [place] [address] Lab: [phone] Dir: Dellia Nims. Reuel Derby, MD      Assessment:  Joanne Collier is a 77 y.o. female with clinical stage IB left breast cancer s/p biopsy on 02/01/2017.  Pathology revealed invasive mammary carcinoma, no special type.  Biopsy was 4 mm and grade III.  There was no DCIS or lymphovascular invasion.  Tumor was ER- , PR positive (11-50%), and Her2/neu 1+.  CA27.29 was 15.2 on 02/08/2017.   Diagnostic left mammogram and ultrasound on 01/11/2017 revealed an indeterminate solid nodule in the  12 o'clock location of the left breast.  Mammogram revealed a small mass with indistinct margins in the upper central portion of the left breast.  Targeted ultrasound showed a 0.6 x 0.4 x 0.4 cm solid nodule  in the 12 o'clock location of the left breast 6 cm from the nipple. Some margins were angular.  There was no associated internal vascularity. There was neither posterior acoustic enhancement or shadowing.  Evaluation of the axilla was negative.  Exam revealed no palpable mass.  Symptomatically, she feels well.  Exam reveals post biopsy changes in the left breast.  Plan: 1.  Discuss staging and treatment of breast cancer.  Patient has clinical stage IB.  Tumor is ER-, PR+, and Her2/neu -.  Discuss planned surgery and final staging based on pathology.  Discuss treatment individualized in patients > 92 years old.  Although she has PR+ disease, testing is 11-50% and she is ER is negative.  She does not classically have triple negative disease, but her tumor may be more aggressive.  Chemotherapy would be considered if final pathology reveals a larger lesion or she has lymph node positive disease.  Discuss follow-up after surgery.  Discuss plan for radiation after surgery as she is scheduled to undergo lumpectomy.  Discuss plan for hormonal therapy.  Discuss tamoxifen versus aromatase inhibitors.  Side effects reviewed.   2.  Labs today:  CBC with diff, CMP, CA27.29. 3.  Bone density study. 4.  RTC 2 weeks after surgery.  Patient to contact clinic.   Lequita Asal, MD  02/08/2017, 11:25 AM

## 2017-02-08 NOTE — Progress Notes (Signed)
See follow up. 

## 2017-02-09 LAB — CANCER ANTIGEN 27.29: CA 27.29: 15.2 U/mL (ref 0.0–38.6)

## 2017-02-09 NOTE — Progress Notes (Signed)
Oncology Nurse Navigator Documentation    Oncology Nurse Navigator Documentation  Navigator Location: [facility] Onc (02/09/17 1200)   )Navigator Encounter Type: Initial MedOnc (02/09/17 1200)                       Treatment Phase: Pre-Tx/Tx Discussion (02/09/17 1200) Barriers/Navigation Needs: Education (02/09/17 1200) Education: Newly Diagnosed Cancer Education (02/09/17 1200) Interventions: Education (02/09/17 1200)     Education Method: Verbal;Written (02/09/17 1200)                Time Spent with Patient: 75 (02/09/17 1200)   Met patient and her husband during her initial medical oncology visit with Dr. Mike Gip.  Patient is planning lumpectomy on 02/23/17 with Dr. Tamala Julian.  She is to follow-up with Dr. Mike Gip on 03/09/17 for final pathology and treatment planning.  Probably radiation and antihormonal therapy.  She is to call if she has any questions or needs.

## 2017-02-11 ENCOUNTER — Encounter: Payer: Self-pay | Admitting: Hematology and Oncology

## 2017-02-14 ENCOUNTER — Inpatient Hospital Stay: Admission: RE | Admit: 2017-02-14 | Payer: Medicare Other | Source: Ambulatory Visit

## 2017-02-15 ENCOUNTER — Encounter
Admission: RE | Admit: 2017-02-15 | Discharge: 2017-02-15 | Disposition: A | Discharge status: Home / Self Care | Payer: Medicare Other | Source: Ambulatory Visit | Attending: Surgery | Admitting: Surgery

## 2017-02-15 ENCOUNTER — Other Ambulatory Visit: Payer: Self-pay

## 2017-02-15 HISTORY — DX: Malignant (primary) neoplasm, unspecified: C80.1

## 2017-02-15 HISTORY — DX: Unspecified osteoarthritis, unspecified site: M19.90

## 2017-02-15 HISTORY — DX: Essential (primary) hypertension: I10

## 2017-02-15 NOTE — Patient Instructions (Signed)
  Your procedure is scheduled on: 02-23-17 FRIDAY Report to [place] @ 8:15 AM  Remember: Instructions that are not followed completely may result in serious medical risk, up to and including death, or upon the discretion of your surgeon and anesthesiologist your surgery may need to be rescheduled.    _x___ 1. Do not eat food after midnight the night before your procedure. NO GUM OR CANDY AFTER MIDNIGHT.  You may drink clear liquids up to 2 hours before you are scheduled to arrive at the hospital for your procedure.  Do not drink clear liquids within 2 hours of your scheduled arrival to the hospital.  Clear liquids include  --Water or Apple juice without pulp  --Clear carbohydrate beverage such as ClearFast or Gatorade  --Black Coffee or Clear Tea (No milk, no creamers, do not add anything to the coffee or Tea    __x__ 2. No Alcohol for 24 hours before or after surgery.   __x__3. No Smoking for 24 prior to surgery.   ____  4. Bring all medications with you on the day of surgery if instructed.    __x__ 5. Notify your doctor if there is any change in your medical condition     (cold, fever, infections).     Do not wear jewelry, make-up, hairpins, clips or nail polish.  Do not wear lotions, powders, or perfumes. You may wear deodorant.  Do not shave 48 hours prior to surgery. Men may shave face and neck.  Do not bring valuables to the hospital.    [facility] is not responsible for any belongings or valuables.               Contacts, dentures or bridgework may not be worn into surgery.  Leave your suitcase in the car. After surgery it may be brought to your room.  For patients admitted to the hospital, discharge time is determined by your treatment team.   Patients discharged the day of surgery will not be allowed to drive home.  You will need someone to drive you home and stay with you the night of your procedure.    Please read over the following fact sheets that you  were given:   [facility] Preparing for Surgery and or MRSA Information   _x___ TAKE THE FOLLOWING MEDICATION THE MORNING OF SURGERY WITH A SMALL SIP OF WATER. These include:  1. ATENOLOL  2.  3.  4.  5.  6.  ____Fleets enema or Magnesium Citrate as directed.   _x___ Use CHG Soap or sage wipes as directed on instruction sheet   ____ Use inhalers on the day of surgery and bring to hospital day of surgery  ____ Stop Metformin and Janumet 2 days prior to surgery.    ____ Take 1/2 of usual insulin dose the night before surgery and none on the morning surgery.   _x___ Follow recommendations from Cardiologist, Pulmonologist or PCP regarding stopping Aspirin, Coumadin, Plavix ,Eliquis, Effient, or Pradaxa, and Pletal-STOP ASPIRIN NOW PER DR SMITH'S INSTRUCTIONS  X____Stop Anti-inflammatories such as Advil, Aleve, Ibuprofen, Motrin, Naproxen, Naprosyn, Goodies powders or aspirin products 48 HOURS PRIOR TO SURGERY-OK to take Tylenol    ____ Stop supplements until after surgery.     ____ Bring C-Pap to the hospital.

## 2017-02-20 ENCOUNTER — Encounter
Admission: RE | Admit: 2017-02-20 | Discharge: 2017-02-20 | Disposition: A | Discharge status: Home / Self Care | Payer: Medicare Other | Source: Ambulatory Visit | Attending: Surgery | Admitting: Surgery

## 2017-02-20 DIAGNOSIS — Z171 Estrogen receptor negative status [ER-]: Secondary | ICD-10-CM | POA: Diagnosis not present

## 2017-02-20 DIAGNOSIS — Z79899 Other long term (current) drug therapy: Secondary | ICD-10-CM | POA: Diagnosis not present

## 2017-02-20 DIAGNOSIS — I10 Essential (primary) hypertension: Secondary | ICD-10-CM | POA: Diagnosis not present

## 2017-02-20 DIAGNOSIS — C50812 Malignant neoplasm of overlapping sites of left female breast: Secondary | ICD-10-CM | POA: Diagnosis not present

## 2017-02-20 DIAGNOSIS — H409 Unspecified glaucoma: Secondary | ICD-10-CM | POA: Diagnosis not present

## 2017-02-20 DIAGNOSIS — Z8041 Family history of malignant neoplasm of ovary: Secondary | ICD-10-CM | POA: Diagnosis not present

## 2017-02-20 DIAGNOSIS — Z7982 Long term (current) use of aspirin: Secondary | ICD-10-CM | POA: Diagnosis not present

## 2017-02-20 DIAGNOSIS — Z7901 Long term (current) use of anticoagulants: Secondary | ICD-10-CM | POA: Diagnosis not present

## 2017-02-20 DIAGNOSIS — Z808 Family history of malignant neoplasm of other organs or systems: Secondary | ICD-10-CM | POA: Diagnosis not present

## 2017-02-20 DIAGNOSIS — Z800 Family history of malignant neoplasm of digestive organs: Secondary | ICD-10-CM | POA: Diagnosis not present

## 2017-02-20 DIAGNOSIS — C50912 Malignant neoplasm of unspecified site of left female breast: Secondary | ICD-10-CM | POA: Diagnosis present

## 2017-02-22 ENCOUNTER — Other Ambulatory Visit: Payer: Self-pay | Admitting: *Deleted

## 2017-02-23 ENCOUNTER — Ambulatory Visit
Admission: RE | Admit: 2017-02-23 | Discharge: 2017-02-23 | Disposition: A | Discharge status: Home / Self Care | Payer: Medicare Other | Source: Ambulatory Visit | Attending: Surgery | Admitting: Surgery

## 2017-02-23 ENCOUNTER — Ambulatory Visit: Payer: Medicare Other | Admitting: Anesthesiology

## 2017-02-23 ENCOUNTER — Encounter
Admission: RE | Admit: 2017-02-23 | Discharge: 2017-02-23 | Disposition: A | Discharge status: Home / Self Care | Payer: Medicare Other | Source: Ambulatory Visit | Attending: Surgery | Admitting: Surgery

## 2017-02-23 ENCOUNTER — Encounter
Admission: RE | Disposition: A | Discharge status: Home / Self Care | Payer: Self-pay | Source: Ambulatory Visit | Attending: Surgery

## 2017-02-23 ENCOUNTER — Other Ambulatory Visit: Payer: Self-pay

## 2017-02-23 DIAGNOSIS — Z8041 Family history of malignant neoplasm of ovary: Secondary | ICD-10-CM | POA: Insufficient documentation

## 2017-02-23 DIAGNOSIS — C50412 Malignant neoplasm of upper-outer quadrant of left female breast: Secondary | ICD-10-CM

## 2017-02-23 DIAGNOSIS — Z800 Family history of malignant neoplasm of digestive organs: Secondary | ICD-10-CM | POA: Insufficient documentation

## 2017-02-23 DIAGNOSIS — Z7982 Long term (current) use of aspirin: Secondary | ICD-10-CM | POA: Insufficient documentation

## 2017-02-23 DIAGNOSIS — C50812 Malignant neoplasm of overlapping sites of left female breast: Secondary | ICD-10-CM | POA: Diagnosis not present

## 2017-02-23 DIAGNOSIS — Z808 Family history of malignant neoplasm of other organs or systems: Secondary | ICD-10-CM | POA: Insufficient documentation

## 2017-02-23 DIAGNOSIS — Z7901 Long term (current) use of anticoagulants: Secondary | ICD-10-CM | POA: Insufficient documentation

## 2017-02-23 DIAGNOSIS — Z79899 Other long term (current) drug therapy: Secondary | ICD-10-CM | POA: Insufficient documentation

## 2017-02-23 DIAGNOSIS — Z171 Estrogen receptor negative status [ER-]: Secondary | ICD-10-CM | POA: Insufficient documentation

## 2017-02-23 DIAGNOSIS — I10 Essential (primary) hypertension: Secondary | ICD-10-CM | POA: Insufficient documentation

## 2017-02-23 DIAGNOSIS — H409 Unspecified glaucoma: Secondary | ICD-10-CM | POA: Insufficient documentation

## 2017-02-23 HISTORY — PX: BREAST LUMPECTOMY: SHX2

## 2017-02-23 HISTORY — PX: SENTINEL NODE BIOPSY: SHX6608

## 2017-02-23 HISTORY — PX: PARTIAL MASTECTOMY WITH NEEDLE LOCALIZATION: SHX6008

## 2017-02-23 HISTORY — PX: BREAST EXCISIONAL BIOPSY: SUR124

## 2017-02-23 SURGERY — PARTIAL MASTECTOMY WITH NEEDLE LOCALIZATION
Anesthesia: General | Site: Breast | Laterality: Left | Wound class: Clean

## 2017-02-23 MED ORDER — BUPIVACAINE-EPINEPHRINE (PF) 0.5% -1:200000 IJ SOLN
INTRAMUSCULAR | Status: AC
  Filled 2017-02-23: qty 30

## 2017-02-23 MED ORDER — GLYCOPYRROLATE 0.2 MG/ML IJ SOLN
INTRAMUSCULAR | Status: DC | PRN
  Administered 2017-02-23: 0.2 mg via INTRAVENOUS

## 2017-02-23 MED ORDER — PROPOFOL 500 MG/50ML IV EMUL
INTRAVENOUS | Status: AC
Start: 2017-02-23 — End: 2017-02-23
  Filled 2017-02-23: qty 50

## 2017-02-23 MED ORDER — TECHNETIUM TC 99M SULFUR COLLOID: 0.6150 | Freq: Once | INTRAVENOUS | Status: DC | PRN

## 2017-02-23 MED ORDER — DEXAMETHASONE SODIUM PHOSPHATE 10 MG/ML IJ SOLN
INTRAMUSCULAR | Status: DC | PRN
  Administered 2017-02-23: 5 mg via INTRAVENOUS

## 2017-02-23 MED ORDER — METHYLENE BLUE 1 % INJ SOLN
INTRAMUSCULAR | Status: AC
  Filled 2017-02-23: qty 10

## 2017-02-23 MED ORDER — FAMOTIDINE 20 MG PO TABS
ORAL_TABLET | ORAL | Status: AC
  Administered 2017-02-23: 20 mg via ORAL
  Filled 2017-02-23: qty 1

## 2017-02-23 MED ORDER — BUPIVACAINE-EPINEPHRINE 0.5% -1:200000 IJ SOLN
INTRAMUSCULAR | Status: DC | PRN
  Administered 2017-02-23: 15 mL

## 2017-02-23 MED ORDER — DEXAMETHASONE SODIUM PHOSPHATE 10 MG/ML IJ SOLN
INTRAMUSCULAR | Status: AC
Start: 2017-02-23 — End: 2017-02-23
  Filled 2017-02-23: qty 1

## 2017-02-23 MED ORDER — OXYCODONE HCL 5 MG/5ML PO SOLN: 5.0000 mg | Freq: Once | ORAL | Status: DC | PRN

## 2017-02-23 MED ORDER — FAMOTIDINE 20 MG PO TABS
20.0000 mg | ORAL_TABLET | Freq: Once | ORAL | Status: AC
  Administered 2017-02-23: 20 mg via ORAL

## 2017-02-23 MED ORDER — FENTANYL CITRATE (PF) 100 MCG/2ML IJ SOLN
INTRAMUSCULAR | Status: DC | PRN
  Administered 2017-02-23 (×4): 25 ug via INTRAVENOUS
  Administered 2017-02-23: 50 ug via INTRAVENOUS

## 2017-02-23 MED ORDER — EPHEDRINE SULFATE 50 MG/ML IJ SOLN
INTRAMUSCULAR | Status: DC | PRN
  Administered 2017-02-23 (×2): 10 mg via INTRAVENOUS
  Administered 2017-02-23: 5 mg via INTRAVENOUS

## 2017-02-23 MED ORDER — MEPERIDINE HCL 50 MG/ML IJ SOLN: 6.2500 mg | INTRAMUSCULAR | Status: DC | PRN

## 2017-02-23 MED ORDER — PROPOFOL 10 MG/ML IV BOLUS
INTRAVENOUS | Status: DC | PRN
  Administered 2017-02-23: 50 mg via INTRAVENOUS
  Administered 2017-02-23: 100 mg via INTRAVENOUS

## 2017-02-23 MED ORDER — OXYCODONE HCL 5 MG PO TABS: 5.0000 mg | ORAL_TABLET | Freq: Once | ORAL | Status: DC | PRN

## 2017-02-23 MED ORDER — LACTATED RINGERS IV SOLN
INTRAVENOUS | Status: DC
  Administered 2017-02-23: 11:00:00 via INTRAVENOUS

## 2017-02-23 MED ORDER — LIDOCAINE HCL (PF) 2 % IJ SOLN
INTRAMUSCULAR | Status: AC
  Filled 2017-02-23: qty 10

## 2017-02-23 MED ORDER — FENTANYL CITRATE (PF) 100 MCG/2ML IJ SOLN: 25.0000 ug | INTRAMUSCULAR | Status: DC | PRN

## 2017-02-23 MED ORDER — PROMETHAZINE HCL 25 MG/ML IJ SOLN: 6.2500 mg | INTRAMUSCULAR | Status: DC | PRN

## 2017-02-23 MED ORDER — FENTANYL CITRATE (PF) 250 MCG/5ML IJ SOLN
INTRAMUSCULAR | Status: AC
  Filled 2017-02-23: qty 5

## 2017-02-23 MED ORDER — HYDROCODONE-ACETAMINOPHEN 5-325 MG PO TABS: 1.0000 | ORAL_TABLET | Freq: Four times a day (QID) | ORAL | 0 refills | Status: DC | PRN

## 2017-02-23 MED ORDER — LIDOCAINE 2% (20 MG/ML) 5 ML SYRINGE
INTRAMUSCULAR | Status: DC | PRN
  Administered 2017-02-23: 60 mg via INTRAVENOUS

## 2017-02-23 MED ORDER — ONDANSETRON HCL 4 MG/2ML IJ SOLN
INTRAMUSCULAR | Status: AC
  Filled 2017-02-23: qty 2

## 2017-02-23 MED ORDER — TECHNETIUM TC 99M SULFUR COLLOID FILTERED
0.6150 | Freq: Once | INTRAVENOUS | Status: AC | PRN
  Administered 2017-02-23: 0.615 via INTRADERMAL

## 2017-02-23 MED ORDER — ONDANSETRON HCL 4 MG/2ML IJ SOLN
INTRAMUSCULAR | Status: DC | PRN
  Administered 2017-02-23: 4 mg via INTRAVENOUS

## 2017-02-23 SURGICAL SUPPLY — 35 items
BLADE SURG 15 STRL LF DISP TIS (BLADE) ×2 IMPLANT
BLADE SURG 15 STRL SS (BLADE) ×2
CANISTER SUCT 1200ML W/VALVE (MISCELLANEOUS) ×4 IMPLANT
CHLORAPREP W/TINT 26ML (MISCELLANEOUS) ×4 IMPLANT
CNTNR SPEC 2.5X3XGRAD LEK (MISCELLANEOUS) ×2
CONT SPEC 4OZ STER OR WHT (MISCELLANEOUS) ×2
CONTAINER SPEC 2.5X3XGRAD LEK (MISCELLANEOUS) ×2 IMPLANT
DERMABOND ADVANCED (GAUZE/BANDAGES/DRESSINGS) ×2
DERMABOND ADVANCED .7 DNX12 (GAUZE/BANDAGES/DRESSINGS) ×2 IMPLANT
DEVICE DUBIN SPECIMEN MAMMOGRA (MISCELLANEOUS) ×4 IMPLANT
DRAPE LAPAROTOMY 77X122 PED (DRAPES) ×4 IMPLANT
ELECT REM PT RETURN 9FT ADLT (ELECTROSURGICAL) ×4
ELECTRODE REM PT RTRN 9FT ADLT (ELECTROSURGICAL) ×2 IMPLANT
GLOVE BIO SURGEON STRL SZ7.5 (GLOVE) ×4 IMPLANT
GOWN STRL REUS W/ TWL LRG LVL3 (GOWN DISPOSABLE) ×6 IMPLANT
GOWN STRL REUS W/TWL LRG LVL3 (GOWN DISPOSABLE) ×6
IV SODIUM CHL 0.9% 500ML (IV SOLUTION) ×4 IMPLANT
KIT RM TURNOVER STRD PROC AR (KITS) ×4 IMPLANT
LABEL OR SOLS (LABEL) IMPLANT
MARGIN MAP 10MM (MISCELLANEOUS) ×4 IMPLANT
NDL SAFETY ECLIPSE 18X1.5 (NEEDLE) IMPLANT
NEEDLE HYPO 18GX1.5 SHARP (NEEDLE)
NEEDLE HYPO 22GX1.5 SAFETY (NEEDLE) IMPLANT
NEEDLE HYPO 25X1 1.5 SAFETY (NEEDLE) ×4 IMPLANT
PACK BASIN MINOR ARMC (MISCELLANEOUS) ×4 IMPLANT
SLEVE PROBE SENORX GAMMA FIND (MISCELLANEOUS) ×4 IMPLANT
SUT CHROMIC 3 0 SH 27 (SUTURE) IMPLANT
SUT CHROMIC 4 0 RB 1X27 (SUTURE) ×8 IMPLANT
SUT ETHILON 3-0 FS-10 30 BLK (SUTURE) ×4
SUT MNCRL 4-0 (SUTURE) ×4
SUT MNCRL 4-0 27XMFL (SUTURE) ×4
SUTURE EHLN 3-0 FS-10 30 BLK (SUTURE) ×2 IMPLANT
SUTURE MNCRL 4-0 27XMF (SUTURE) ×4 IMPLANT
SYR 10ML LL (SYRINGE) ×4 IMPLANT
WATER STERILE IRR 1000ML POUR (IV SOLUTION) IMPLANT

## 2017-02-23 NOTE — Anesthesia Postprocedure Evaluation (Signed)
Anesthesia Post Note  Patient: Joanne Collier  Procedure(s) Performed: PARTIAL MASTECTOMY WITH NEEDLE LOCALIZATION (Left Breast) SENTINEL NODE BIOPSY (Left Axilla)  Patient location during evaluation: PACU Anesthesia Type: General Level of consciousness: awake and alert and oriented Pain management: pain level controlled Vital Signs Assessment: post-procedure vital signs reviewed and stable Respiratory status: spontaneous breathing, nonlabored ventilation and respiratory function stable Cardiovascular status: blood pressure returned to baseline and stable Postop Assessment: no signs of nausea or vomiting Anesthetic complications: no     Last Vitals:  Vitals:   02/23/17 1332 02/23/17 1347  BP: 133/70 (!) 152/70  Pulse: 79 74  Resp: 19 20  Temp:    SpO2: 98% 98%    Last Pain:  Vitals:   02/23/17 1347  TempSrc:   PainSc: 0-No pain                 Kehaulani Fruin

## 2017-02-23 NOTE — H&P (Signed)
  She comes prepared for left partial mastectomy with sentinel lymph node biopsy.  She has had insertion of Kopan's wire.  Mammogram images were reviewed.  She reports no change in overall condition since office visit.  The left side was marked YES.  I discussed the plan for surgery

## 2017-02-23 NOTE — Transfer of Care (Signed)
Immediate Anesthesia Transfer of Care Note  Patient: Joanne Collier  Procedure(s) Performed: PARTIAL MASTECTOMY WITH NEEDLE LOCALIZATION (Left Breast) SENTINEL NODE BIOPSY (Left Axilla)  Patient Location: PACU  Anesthesia Type:General  Level of Consciousness: awake, alert  and oriented  Airway & Oxygen Therapy: Patient Spontanous Breathing and Patient connected to face mask oxygen  Post-op Assessment: Report given to RN and Post -op Vital signs reviewed and stable  Post vital signs: Reviewed and stable  Last Vitals:  Vitals:   02/23/17 0951 02/23/17 1317  BP: (!) 160/72   Pulse: (!) 55   Resp: 16   Temp: (!) 36.2 C (!) (P) 36 C  SpO2: 100%     Last Pain:  Vitals:   02/23/17 0951  TempSrc: Tympanic         Complications: No apparent anesthesia complications

## 2017-02-23 NOTE — Anesthesia Procedure Notes (Signed)
Procedure Name: LMA Insertion Date/Time: 02/23/2017 11:35 AM Performed by: Dionne Bucy, CRNA Pre-anesthesia Checklist: Patient identified, Patient being monitored, Timeout performed, Emergency Drugs available and Suction available Patient Re-evaluated:Patient Re-evaluated prior to induction Oxygen Delivery Method: Circle system utilized Preoxygenation: Pre-oxygenation with 100% oxygen Induction Type: IV induction Ventilation: Mask ventilation without difficulty LMA: LMA inserted LMA Size: 4.0 Tube type: Oral Number of attempts: 1 Placement Confirmation: positive ETCO2 and breath sounds checked- equal and bilateral Tube secured with: Tape Dental Injury: Teeth and Oropharynx as per pre-operative assessment

## 2017-02-23 NOTE — Discharge Instructions (Addendum)
Take Tylenol or Norco if needed for pain.  Should not drive or do anything dangerous when taking Norco.  May shower and blot dry.  Wear bra as desired for comfort and support.     AMBULATORY SURGERY  DISCHARGE INSTRUCTIONS   1) The drugs that you were given will stay in your system until tomorrow so for the next 24 hours you should not:  A) Drive an automobile B) Make any legal decisions C) Drink any alcoholic beverage   2) You may resume regular meals tomorrow.  Today it is better to start with liquids and gradually work up to solid foods.  You may eat anything you prefer, but it is better to start with liquids, then soup and crackers, and gradually work up to solid foods.   3) Please notify your doctor immediately if you have any unusual bleeding, trouble breathing, redness and pain at the surgery site, drainage, fever, or pain not relieved by medication.    4) Additional Instructions:        Please contact your physician with any problems or Same Day Surgery at [phone], Monday through Friday 6 am to 4 pm, or [place] [facility] number at [phone].

## 2017-02-23 NOTE — Anesthesia Post-op Follow-up Note (Signed)
Anesthesia QCDR form completed.        

## 2017-02-23 NOTE — OR Nursing (Signed)
Dr. Tamala Julian in to see pt 1225 pm

## 2017-02-23 NOTE — Op Note (Signed)
OPERATIVE REPORT  PREOPERATIVE  DIAGNOSIS: .  Left breast cancer  POSTOPERATIVE DIAGNOSIS: .  Left breast cancer  PROCEDURE: .  Left partial mastectomy with axillary sentinel lymph node biopsy  ANESTHESIA:  General  SURGEON: Rochel Brome  MD   INDICATIONS: .  She had recent mammograms with findings of a density in the upper aspect of the left breast.  Core needle biopsy demonstrated infiltrating mammary carcinoma.  Surgery was recommended for definitive treatment.  She had preoperative insertion of Kopan's wire.  She had preoperative injection of radioactive technetium  sulfur colloid.  With the patient on the operating table in the supine position under general anesthesia with the left arm was placed on the lateral arm support.  The dressing was removed from the left breast exposing the Kopan's wire which entered the 12 o'clock position of the left breast approximately 6 cm from the nipple.  The wire was cut 2 cm from the skin.  The breast was prepared with ChloraPrep and draped in a sterile manner.  A curvilinear incision was made at 11:00 to 2 o'clock position of the left breast 5 cm from the nipple the dissection was carried down through subcutaneous tissues.  Electrocautery was used for hemostasis.  The Kopan's wire was encountered.  A mass of tissue surrounding the wire during the course of dissection I could palpate some firmness at the site which helped to direct the dissection.  One bleeding point was suture ligated with 4-0 chromic.  Margin maps were sutured to the specimen to mark the medial lateral cranial caudal and deep margins.  There was an narrow ellipse of skin resected with the underlying specimen.  Specimen mammogram demonstrated the presence of the biopsy marker centrally located.  The pathologist called to report margins appear to be satisfactory.  Attention was turned to the axilla which was probed with a gamma counter demonstrating the location of radioactivity in the  inferior aspect of the axilla.  An oblique incision was made approximately 4 cm in length and carried down through subcutaneous tissues.  One traversing artery was divided between 4-0 chromic suture ligatures.  Dissection was carried down deeply within the axilla adjacent to the rib cage to encounter a lymph node with radioactivity which was resected with some surrounding fatty tissue.  The ex vivo counts per second were in the range of 1500-1600.  The background count was minimal.  There was no remaining palpable mass in the axilla.  Hemostasis was intact.  The partial mastectomy wound was further examined and several tiny bleeding points cauterized hemostasis subsequently appeared to be intact.  Tissues surrounding cautery artifact were infiltrated with 0.5% Sensorcaine with epinephrine.  Subcuticular tissues were infiltrated as well.  Subcutaneous tissues were closed with interrupted 4-0 chromic sutures.  The skin was closed with running 4-0 Monocryl subcuticular suture.  The axillary wound was inspected and could see hemostasis was intact subcuticular tissues were infiltrated with Sensorcaine.  The wound was closed with running 4-0 Monocryl.  Both wounds were treated with Dermabond and allowed to dry  The patient tolerated surgery satisfactorily and was then prepared for transfer to the recovery room  Rochel Brome MD

## 2017-02-23 NOTE — Anesthesia Preprocedure Evaluation (Signed)
Anesthesia Evaluation  Patient identified by MRN, date of birth, ID band Patient awake    Reviewed: Allergy & Precautions, NPO status , Patient's Chart, lab work & pertinent test results  History of Anesthesia Complications Negative for: history of anesthetic complications  Airway Mallampati: II  TM Distance: >3 FB Neck ROM: Full    Dental no notable dental hx.    Pulmonary neg pulmonary ROS, neg sleep apnea, neg COPD,    breath sounds clear to auscultation- rhonchi (-) wheezing      Cardiovascular hypertension, Pt. on medications (-) CAD, (-) Past MI, (-) Cardiac Stents and (-) CABG  Rhythm:Regular Rate:Normal - Systolic murmurs and - Diastolic murmurs    Neuro/Psych negative neurological ROS  negative psych ROS   GI/Hepatic negative GI ROS, Neg liver ROS,   Endo/Other  negative endocrine ROS  Renal/GU negative Renal ROS     Musculoskeletal  (+) Arthritis ,   Abdominal (+) - obese,   Peds  Hematology negative hematology ROS (+)   Anesthesia Other Findings Past Medical History: No date: Arthritis     Comment:  LEFT KNEE No date: Cancer ([place]) No date: Glaucoma No date: Hypertension   Reproductive/Obstetrics                             Anesthesia Physical Anesthesia Plan  ASA: II  Anesthesia Plan: General   Post-op Pain Management:    Induction: Intravenous  PONV Risk Score and Plan: 2 and Dexamethasone and Ondansetron  Airway Management Planned: LMA  Additional Equipment:   Intra-op Plan:   Post-operative Plan:   Informed Consent: I have reviewed the patients History and Physical, chart, labs and discussed the procedure including the risks, benefits and alternatives for the proposed anesthesia with the patient or authorized representative who has indicated his/her understanding and acceptance.   Dental advisory given  Plan Discussed with: CRNA and  Anesthesiologist  Anesthesia Plan Comments:         Anesthesia Quick Evaluation

## 2017-02-27 ENCOUNTER — Other Ambulatory Visit: Payer: Self-pay | Admitting: Pathology

## 2017-02-27 LAB — SURGICAL PATHOLOGY

## 2017-03-06 ENCOUNTER — Other Ambulatory Visit: Payer: Self-pay | Admitting: *Deleted

## 2017-03-09 ENCOUNTER — Inpatient Hospital Stay: Payer: Medicare Other | Attending: Hematology and Oncology | Admitting: Hematology and Oncology

## 2017-03-09 ENCOUNTER — Other Ambulatory Visit: Payer: Self-pay

## 2017-03-09 ENCOUNTER — Encounter: Payer: Self-pay | Admitting: Hematology and Oncology

## 2017-03-09 VITALS — BP 124/70 | HR 64 | Temp 98.2°F | Wt 165.3 lb

## 2017-03-09 DIAGNOSIS — Z9889 Other specified postprocedural states: Secondary | ICD-10-CM | POA: Diagnosis not present

## 2017-03-09 DIAGNOSIS — H409 Unspecified glaucoma: Secondary | ICD-10-CM | POA: Diagnosis not present

## 2017-03-09 DIAGNOSIS — Z171 Estrogen receptor negative status [ER-]: Secondary | ICD-10-CM

## 2017-03-09 DIAGNOSIS — Z800 Family history of malignant neoplasm of digestive organs: Secondary | ICD-10-CM | POA: Diagnosis not present

## 2017-03-09 DIAGNOSIS — Z7982 Long term (current) use of aspirin: Secondary | ICD-10-CM | POA: Insufficient documentation

## 2017-03-09 DIAGNOSIS — C50212 Malignant neoplasm of upper-inner quadrant of left female breast: Secondary | ICD-10-CM | POA: Diagnosis not present

## 2017-03-09 DIAGNOSIS — Z8041 Family history of malignant neoplasm of ovary: Secondary | ICD-10-CM | POA: Insufficient documentation

## 2017-03-09 DIAGNOSIS — I10 Essential (primary) hypertension: Secondary | ICD-10-CM | POA: Insufficient documentation

## 2017-03-09 DIAGNOSIS — Z79899 Other long term (current) drug therapy: Secondary | ICD-10-CM | POA: Insufficient documentation

## 2017-03-09 DIAGNOSIS — E785 Hyperlipidemia, unspecified: Secondary | ICD-10-CM | POA: Insufficient documentation

## 2017-03-09 NOTE — Progress Notes (Signed)
[facility] day:  03/09/2017  Chief Complaint: Joanne Collier is a 77 y.o. female with stage IB left breast cancer who is seen after partial mastectomy and discussion regarding direction of therapy.  HPI:   The patient was last seen in the medical oncology clinic on 02/08/2017 for initial consultation.  She had clinical stage IB left breast cancer s/p biopsy.  Pathology revealed invasive mammary carcinoma, no special type.  Tumor was ER- , PR positive (11-50%), and Her2/neu 1+.  We discussed treatment based on her final pathology.  She underwent partial mastectomy with sentinel lymph node biopsy on 02/23/2017 by Dr. Rochel Brome.  Pathology revealed a 6 mm grade III invasive mammary carcinoma of no special type.  There was no lymphovascular invasion.  There was no DCIS.  There were biopsy changes and a marker clip present.  Margins were negative.  There was no tumor in one sentinel lymph node.  Pathologic stage was pT1b pN0 (sn).  She is scheduled for bone density study on 03/13/2017.  Symptomatically, she is doing well. She denies any physical concerns today. Patient feels "pretty good". Patient denies any B symptoms or interval infections.  Patient is recovering well after surgery. Patient scheduled to follow up with Dr. Tamala Julian in 2 weeks. Patient is eating well. She has lost 1 pound.    Past Medical History:  Diagnosis Date  . Arthritis    LEFT KNEE  . Cancer ([place])   . Glaucoma   . Hypertension     Past Surgical History:  Procedure Laterality Date  . BREAST BIOPSY Left 02/01/2017   INVASIVE MAMMARY CARCINOMA bx  . BREAST EXCISIONAL BIOPSY Left 02/23/2017   lumpectomy nl and sn  . ENDOMETRIAL BIOPSY    . EYE SURGERY Bilateral   . PARTIAL MASTECTOMY WITH NEEDLE LOCALIZATION Left 02/23/2017   Procedure: PARTIAL MASTECTOMY WITH NEEDLE LOCALIZATION;  Surgeon: Leonie Green, MD;  Location: [facility] ORS;  Service: General;  Laterality: Left;  .  SENTINEL NODE BIOPSY Left 02/23/2017   Procedure: SENTINEL NODE BIOPSY;  Surgeon: Leonie Green, MD;  Location: [facility] ORS;  Service: General;  Laterality: Left;    Family History  Problem Relation Age of Onset  . Cancer Mother 59       ovarian  . Cancer Brother 34       colon  . Breast cancer Neg Hx     Social History:  reports that  has never smoked. she has never used smokeless tobacco. She reports that she does not drink alcohol or use drugs.  She is Lexicographer.  She worked in [employer].  She denies any exposure to radiation or toxins.  She has 3 children ages 38, 34, and 21.  She had an infant who died at 69 months.  She lives in [place].  The patient is accompanied by her husband, [name], today.  Allergies: No Known Allergies  Current Medications: Current Outpatient Medications  Medication Sig Dispense Refill  . aspirin EC 81 MG tablet Take 81 mg by mouth daily.    Marland Kitchen atenolol (TENORMIN) 25 MG tablet Take 25 mg by mouth every morning.     . dorzolamide (TRUSOPT) 2 % ophthalmic solution Place 1 drop into both eyes 2 (two) times daily.    . TRAVATAN Z 0.004 % SOLN ophthalmic solution Place 1 drop into the right eye at bedtime.  1  . HYDROcodone-acetaminophen (NORCO) 5-325 MG tablet Take 1-2 tablets by mouth  every 6 (six) hours as needed for moderate pain. (Patient not taking: Reported on 03/09/2017) 12 tablet 0  . ibuprofen (ADVIL,MOTRIN) 200 MG tablet Take 200 mg by mouth every 6 (six) hours as needed for headache or moderate pain.     No current facility-administered medications for this visit.     Review of Systems:  GENERAL:  Feels good.  No fevers or sweats. Weight down 1 pound.  PERFORMANCE STATUS (ECOG):  0 HEENT:  s/p left eye surgery (2008) and right cataract surgery (2018).  No visual changes, runny nose, sore throat, mouth sores or tenderness. Lungs: No shortness of breath or cough.  No hemoptysis. Cardiac:  No chest pain, palpitations, orthopnea, or  PND. GI:  No nausea, vomiting, diarrhea, constipation, melena or hematochezia. GU:  No urgency, frequency, dysuria, or hematuria. Musculoskeletal:  No back pain.  No joint pain.  No muscle tenderness. Extremities:  No pain or swelling. Skin:  Healing well s/p breast surgery.  No rashes or skin changes. Neuro:  No headache, numbness or weakness, balance or coordination issues. Endocrine:  No diabetes, thyroid issues, hot flashes or night sweats. Psych:  No mood changes, depression or anxiety. Pain:  No focal pain. Review of systems:  All other systems reviewed and found to be negative.  Physical Exam: Blood pressure 124/70, pulse 64, temperature 98.2 F (36.8 C), temperature source Tympanic, weight 165 lb 4.8 oz (75 kg). GENERAL:  Well developed, well nourished, woman sitting comfortably in the exam room in no acute distress. MENTAL STATUS:  Alert and oriented to person, place and time. HEAD:  Pearline Cables short hair.  Normocephalic, atraumatic, face symmetric, no Cushingoid features. EYES:  Glasses.  Brown eyes.  s/p catract surgery.  Pupils equal round and reactive to light and accomodation.  No conjunctivitis or scleral icterus. ENT:  Oropharynx clear without lesion.  Tongue normal. Mucous membranes moist.  RESPIRATORY:  Clear to auscultation without rales, wheezes or rhonchi. CARDIOVASCULAR:  Regular rate and rhythm without murmur, rub or gallop. BREAST:  Right breast without masses, skin changes or nipple discharge.  Left breast s/p partial mastectomy with well healing arched incision and underlying hematoma.  No skin changes or nipple discharge.  Left axillary hematoma.   ABDOMEN:  Soft, non-tender, with active bowel sounds, and no hepatosplenomegaly.  No masses. SKIN:  No rashes, ulcers or lesions. EXTREMITIES: No edema, no skin discoloration or tenderness.  No palpable cords. LYMPH NODES: No palpable cervical, supraclavicular, axillary or inguinal adenopathy  NEUROLOGICAL:  Unremarkable. PSYCH:  Appropriate.   No visits with results within 3 Day(s) from this visit.  Latest known visit with results is:  Hospital Outpatient Visit on 02/01/2017  Component Date Value Ref Range Status  . SURGICAL PATHOLOGY 02/01/2017    Final-Edited                   Value:Surgical Pathology THIS IS AN ADDENDUM REPORT CASE: [id number] PATIENT: Joanne Collier Surgical Pathology Report Addendum  Reason for Addendum #1:  Immunohistochemistry results  SPECIMEN SUBMITTED: A. Breast, left, 12:00  CLINICAL HISTORY: Small solid nodule in the 12 o'clock location of the left breast 6 cm from the nipple which measures 0.6 x 0.4 x 0.4 cm  PRE-OPERATIVE DIAGNOSIS: Concern for malignancy  POST-OPERATIVE DIAGNOSIS: Same as pre-op     DIAGNOSIS: A. BREAST, LEFT 12:00; ULTRASOUND GUIDED BIOPSY: - INVASIVE MAMMARY CARCINOMA, NO SPECIAL TYPE.  Size of invasive carcinoma: 4 mm in this sample Histologic grade of invasive carcinoma: overall grade 3  Glandular/tubular differentiation score: 3      Nuclear pleomorphism score: 3      Mitotic rate score: 3      Total score: 3  Ductal carcinoma in situ: Not identified Lymphovascular invasion: Not identified ER/PR/HER2: Immunohistochemistry will be performed on b                         lock A1, with reflex to [place] for HER2 2+. The results will be reported in an addendum.  Comment: The definitive grade will be assigned on the excisional specimen. These findings were communicated to [place] in Dr. Terrilyn Saver office on 02/02/2017. Read back procedure was performed.     GROSS DESCRIPTION:  The specimen is received in a formalin-filled container labeled with the patient's name and ultrasound-guided left breast biopsy 12:00, 6 cm from nipple.  Core pieces: 4 Measurement: 1.1-1.9 cm in length and 0.2 cm in diameter Comments: yellow-red lobulated fibrofatty, marked blue  Entirely submitted in cassette(s): 1  Time/Date in  fixative: collected and placed in formalin at 8:42 AM on 02/01/2017 Total fixation time: 9 hours    Final Diagnosis performed by Quay Burow, MD.  Electronically signed 02/02/2017 11:13:04AM   The electronic signature indicates that the named Attending Pathologist has evaluated the specimen  Technical component performed at [facility], [address] [place], [place] [address] Lab: [phone] Dir: Rush Farmer, MD, MMM  Professional component performed at [facility], [facility], [place], [place], [place] [address] Lab: [phone] Dir: Dellia Nims. Rubinas, MD   Breast Biomarker Reporting Template  BREAST BIOMARKER TESTS Estrogen Receptor (ER) Status: Negative Background internal control stains as expected Progesterone Receptor (PgR) Status: Positive, range 11-50% of nuclei      Average intensity of staining: Moderate HER2 (by immunohistochemistry): Negative (1+)  METHODS Cold Ischemia and Fixation Times: Meet requirements specified in latest version of the ASCO/CAP guidelines Testing Performed on Block Number(s): A1 Fixative: Formalin Estrogen Receptor:  FDA cleared ([place])                    Primary Antibody:  SP1 Progesterone Receptor: FDA cleared ([place])                   Primary Antibody: 1E2 HER2 (by immunohistochemistry): FDA approved ([place])                                                      Primary Antibody: HercepTest  Immunohistochemistry controls worked appropriately.  Immunohistochemistry controls worked appropriately. Slides were prepared by [facility] for Molecular Biology and Pathology, [place], [place], and interpreted by [facility] value].      Addendum #1 performed by Quay Burow, MD.  Electronically signed 02/06/2017 1:50:03PM    Technical component performed at [facility], [address], [place], [place] [address] Lab: [phone] Dir: Rush Farmer, MD, MMM  Professional component performed at  [facility], [facility], [place], [place], [place] [address] Lab: [phone] Dir: Dellia Nims. Reuel Derby, MD      Assessment:  Joanne Collier is a 77 y.o. female with stage IB left breast cancer s/p partial mastectomy with sentinel lymph node biopsy on  02/23/2017.  Pathology revealed a 6 mm grade III invasive mammary carcinoma of no special type.  There was no lymphovascular invasion.  There was no DCIS.  There were biopsy changes and a marker clip present.  Margins were negative.  There was no tumor in one sentinel lymph node.  Tumor was ER- , PR positive (11-50%), and Her2/neu 1+.  Pathologic stage was pT1b pN0 (sn).  CA27.29 was 15.2 on 02/08/2017.  Diagnostic left mammogram and ultrasound on 01/11/2017 revealed an indeterminate solid nodule in the 12 o'clock location of the left breast.  Mammogram revealed a small mass with indistinct margins in the upper central portion of the left breast.  Targeted ultrasound showed a 0.6 x 0.4 x 0.4 cm solid nodule in the 12 o'clock location of the left breast 6 cm from the nipple. Some margins were angular.  There was no associated internal vascularity. There was neither posterior acoustic enhancement or shadowing.  Evaluation of the axilla was negative.  Exam revealed no palpable mass.  Symptomatically, she is healing well from surgery.  Exam reveals post-operative changes in the left breast and axillae.  Plan: 1.  Discuss final pathology.  Patient has stage IB (T1bN0M0) breast cancer.  Tumor is ER-, PR+, and Her2/neu -.  Review treatment individualized in patients > 88 years old.  Although she has PR+ disease, testing is 11-50% and she is ER is negative.  She does not classically have triple negative disease, but her tumor may be more aggressive.  Given her age, small tumor size, and lymph node negative status, discuss no plan for chemotherapy.  Discuss plan for radiation.  Discuss plan for hormonal therapy after radiation.  Discuss tamoxifen  versus aromatase inhibitors.  Side effects reviewed. 2.  Discuss patient on breast tumor conference on 03/12/2017.  Call patient after tumor board.  3.  Refer to radiation oncology. 4.  Follow-up bone density study. 5.  RTC after radiation completion. Patient to call for an appointment. Patient will need labs (CBC with diff, CMP, CA27.29).    Honor Loh, NP  03/09/2017, 11:39 AM   I saw and evaluated the patient, participating in the key portions of the service and reviewing pertinent diagnostic studies and records.  I reviewed the nurse practitioner's note and agree with the findings and the plan.  The assessment and plan were discussed with the patient.  Multiple questions were asked by the patient and answered.   Nolon Stalls, MD 03/09/2017,11:39 AM

## 2017-03-13 ENCOUNTER — Ambulatory Visit
Admission: RE | Admit: 2017-03-13 | Discharge: 2017-03-13 | Disposition: A | Discharge status: Home / Self Care | Payer: Medicare Other | Source: Ambulatory Visit | Attending: Hematology and Oncology | Admitting: Hematology and Oncology

## 2017-03-13 DIAGNOSIS — Z13820 Encounter for screening for osteoporosis: Secondary | ICD-10-CM | POA: Diagnosis present

## 2017-03-13 DIAGNOSIS — C50212 Malignant neoplasm of upper-inner quadrant of left female breast: Secondary | ICD-10-CM | POA: Diagnosis present

## 2017-03-13 DIAGNOSIS — Z171 Estrogen receptor negative status [ER-]: Secondary | ICD-10-CM | POA: Diagnosis present

## 2017-03-20 ENCOUNTER — Institutional Professional Consult (permissible substitution): Payer: Medicare Other | Admitting: Radiation Oncology

## 2017-03-23 ENCOUNTER — Encounter: Payer: Self-pay | Admitting: Radiation Oncology

## 2017-03-23 ENCOUNTER — Other Ambulatory Visit: Payer: Self-pay

## 2017-03-23 ENCOUNTER — Ambulatory Visit
Admission: RE | Admit: 2017-03-23 | Discharge: 2017-03-23 | Disposition: A | Discharge status: Home / Self Care | Payer: Medicare Other | Source: Ambulatory Visit | Attending: Radiation Oncology | Admitting: Radiation Oncology

## 2017-03-23 VITALS — BP 139/84 | HR 67 | Temp 98.4°F | Wt 166.3 lb

## 2017-03-23 DIAGNOSIS — M129 Arthropathy, unspecified: Secondary | ICD-10-CM | POA: Diagnosis not present

## 2017-03-23 DIAGNOSIS — Z7982 Long term (current) use of aspirin: Secondary | ICD-10-CM | POA: Diagnosis not present

## 2017-03-23 DIAGNOSIS — C50212 Malignant neoplasm of upper-inner quadrant of left female breast: Secondary | ICD-10-CM | POA: Insufficient documentation

## 2017-03-23 DIAGNOSIS — Z510 Encounter for antineoplastic radiation therapy: Secondary | ICD-10-CM | POA: Diagnosis not present

## 2017-03-23 DIAGNOSIS — Z79899 Other long term (current) drug therapy: Secondary | ICD-10-CM | POA: Insufficient documentation

## 2017-03-23 DIAGNOSIS — Z9012 Acquired absence of left breast and nipple: Secondary | ICD-10-CM | POA: Diagnosis not present

## 2017-03-23 DIAGNOSIS — Z809 Family history of malignant neoplasm, unspecified: Secondary | ICD-10-CM | POA: Insufficient documentation

## 2017-03-23 DIAGNOSIS — Z170 Estrogen receptor positive status [ER+]: Secondary | ICD-10-CM

## 2017-03-23 DIAGNOSIS — I10 Essential (primary) hypertension: Secondary | ICD-10-CM | POA: Insufficient documentation

## 2017-03-23 DIAGNOSIS — Z171 Estrogen receptor negative status [ER-]: Secondary | ICD-10-CM | POA: Insufficient documentation

## 2017-03-23 NOTE — Consult Note (Signed)
NEW PATIENT EVALUATION  Name: Joanne Collier  MRN: [id number]  Date:   03/23/2017     DOB: 1940/07/26   This 77 y.o. female patient presents to the clinic for initial evaluation of stage I (T1 b N0 M0) invasive mammary carcinoma of the left breast status post wide local excision and sentinel node biopsy ER negative PR slightly positive HER-2/neu not overexpressed.  REFERRING PHYSICIAN: Derinda Late, MD  CHIEF COMPLAINT:  Chief Complaint  Patient presents with  . Breast Cancer    Initial Evaluation    DIAGNOSIS: The encounter diagnosis was Malignant neoplasm of upper-inner quadrant of left breast in female, estrogen receptor positive ([place]).   PREVIOUS INVESTIGATIONS:  Pathology reports reviewed Mammograms and ultrasound reviewed Clinical notes reviewed   HPI: Patient is a 77 year old female who presented with an abnormal mammogram of her left breast showing a 0.6 x 0.4 cm lesion 12:00 position 6 cm from the nipple concerning for malignancy. She underwent ultrasound-guided biopsy which was positive for invasive mammary carcinoma ER negative PR low HER-2/neu not overexpressed. She went on to have a wide local excision for a 6 mm lesion with margins clear at 1 cm. One sentinel lymph node was negative. Tumor was overall grade 3. She has done well postoperatively. She does have a seroma in her lumpectomy cavity. She's been seen by medical oncology and has been recommended for antiestrogen therapy as well as adjuvant radiation therapy. She seen today for opinion and is doing well. She specifically denies breast tenderness cough or bone pain.  PLANNED TREATMENT REGIMEN: Left hypofractionated whole breast radiation  PAST MEDICAL HISTORY:  has a past medical history of Arthritis, Cancer ([place]), Glaucoma, and Hypertension.    PAST SURGICAL HISTORY:  Past Surgical History:  Procedure Laterality Date  . BREAST BIOPSY Left 02/01/2017   INVASIVE MAMMARY CARCINOMA bx  . BREAST EXCISIONAL BIOPSY Left  02/23/2017   lumpectomy nl and sn  . ENDOMETRIAL BIOPSY    . EYE SURGERY Bilateral   . PARTIAL MASTECTOMY WITH NEEDLE LOCALIZATION Left 02/23/2017   Procedure: PARTIAL MASTECTOMY WITH NEEDLE LOCALIZATION;  Surgeon: Leonie Green, MD;  Location: [facility] ORS;  Service: General;  Laterality: Left;  . SENTINEL NODE BIOPSY Left 02/23/2017   Procedure: SENTINEL NODE BIOPSY;  Surgeon: Leonie Green, MD;  Location: [facility] ORS;  Service: General;  Laterality: Left;    FAMILY HISTORY: family history includes Cancer (age of onset: 35) in her brother; Cancer (age of onset: 50) in her mother.  SOCIAL HISTORY:  reports that  has never smoked. she has never used smokeless tobacco. She reports that she does not drink alcohol or use drugs.  ALLERGIES: Patient has no known allergies.  MEDICATIONS:  Current Outpatient Medications  Medication Sig Dispense Refill  . aspirin EC 81 MG tablet Take 81 mg by mouth daily.    Marland Kitchen atenolol (TENORMIN) 25 MG tablet Take 25 mg by mouth every morning.     . cromolyn (OPTICROM) 4 % ophthalmic solution     . dorzolamide (TRUSOPT) 2 % ophthalmic solution Place 1 drop into both eyes 2 (two) times daily.    Marland Kitchen HYDROcodone-acetaminophen (NORCO) 5-325 MG tablet Take 1-2 tablets by mouth every 6 (six) hours as needed for moderate pain. (Patient not taking: Reported on 03/09/2017) 12 tablet 0  . ibuprofen (ADVIL,MOTRIN) 200 MG tablet Take 200 mg by mouth every 6 (six) hours as needed for headache or moderate pain.    . TRAVATAN Z 0.004 % SOLN ophthalmic solution  Place 1 drop into the right eye at bedtime.  1   No current facility-administered medications for this encounter.     ECOG PERFORMANCE STATUS:  0 - Asymptomatic  REVIEW OF SYSTEMS:  Patient denies any weight loss, fatigue, weakness, fever, chills or night sweats. Patient denies any loss of vision, blurred vision. Patient denies any ringing  of the ears or hearing loss. No irregular heartbeat. Patient denies heart  murmur or history of fainting. Patient denies any chest pain or pain radiating to her upper extremities. Patient denies any shortness of breath, difficulty breathing at night, cough or hemoptysis. Patient denies any swelling in the lower legs. Patient denies any nausea vomiting, vomiting of blood, or coffee ground material in the vomitus. Patient denies any stomach pain. Patient states has had normal bowel movements no significant constipation or diarrhea. Patient denies any dysuria, hematuria or significant nocturia. Patient denies any problems walking, swelling in the joints or loss of balance. Patient denies any skin changes, loss of hair or loss of weight. Patient denies any excessive worrying or anxiety or significant depression. Patient denies any problems with insomnia. Patient denies excessive thirst, polyuria, polydipsia. Patient denies any swollen glands, patient denies easy bruising or easy bleeding. Patient denies any recent infections, allergies or URI. Patient "s visual fields have not changed significantly in recent time.    PHYSICAL EXAM: BP 139/84   Pulse 67   Temp 98.4 F (36.9 C)   Wt 166 lb 5.4 oz (75.4 kg)   BMI 26.85 kg/m  Left breast is wide local excision site which is healing well. She does have a seroma present no other dominant mass or nodularity is noted in either breast in 2 positions examined. No axillary or supraclavicular adenopathy is appreciated. Well-developed well-nourished patient in NAD. HEENT reveals PERLA, EOMI, discs not visualized.  Oral cavity is clear. No oral mucosal lesions are identified. Neck is clear without evidence of cervical or supraclavicular adenopathy. Lungs are clear to A&P. Cardiac examination is essentially unremarkable with regular rate and rhythm without murmur rub or thrill. Abdomen is benign with no organomegaly or masses noted. Motor sensory and DTR levels are equal and symmetric in the upper and lower extremities. Cranial nerves II through  XII are grossly intact. Proprioception is intact. No peripheral adenopathy or edema is identified. No motor or sensory levels are noted. Crude visual fields are within normal range.  LABORATORY DATA: Pathology reports reviewed    RADIOLOGY RESULTS: Ultrasound and mammograms reviewed   IMPRESSION: Stage I ER negative PR positive HER-2/neu negative invasive mammary carcinoma of the left breast status post wide local excision and sentinel node biopsy in 77 year old female  PLAN: At this time I believe patient will be good candidate for hypofractionated course of radiation therapy to her left breast. Would plan on delivering 42.5 gray to her left breast boosting her scar another thousand centigray in 5 fractions. Risks and benefits of treatment including skin reaction fatigue alteration of blood counts possible inclusion of superficial lung all were discussed in detail with the patient and her husband. They both seem to comprehend my treatment plan well. I personally set up and ordered CT simulation for next week. Patient will follow-up with medical oncology for possible antiestrogen therapy after completion of radiation.  I would like to take this opportunity to thank you for allowing me to participate in the care of your patient.Noreene Filbert, MD

## 2017-03-29 ENCOUNTER — Ambulatory Visit
Admission: RE | Admit: 2017-03-29 | Discharge: 2017-03-29 | Disposition: A | Discharge status: Home / Self Care | Payer: Medicare Other | Source: Ambulatory Visit | Attending: Radiation Oncology | Admitting: Radiation Oncology

## 2017-03-29 DIAGNOSIS — C50212 Malignant neoplasm of upper-inner quadrant of left female breast: Secondary | ICD-10-CM | POA: Diagnosis not present

## 2017-04-06 ENCOUNTER — Other Ambulatory Visit: Payer: Self-pay | Admitting: *Deleted

## 2017-04-06 DIAGNOSIS — C50212 Malignant neoplasm of upper-inner quadrant of left female breast: Secondary | ICD-10-CM

## 2017-04-09 ENCOUNTER — Ambulatory Visit
Admission: RE | Admit: 2017-04-09 | Discharge: 2017-04-09 | Disposition: A | Discharge status: Home / Self Care | Payer: Medicare Other | Source: Ambulatory Visit | Attending: Radiation Oncology | Admitting: Radiation Oncology

## 2017-04-09 DIAGNOSIS — Z170 Estrogen receptor positive status [ER+]: Secondary | ICD-10-CM | POA: Insufficient documentation

## 2017-04-09 DIAGNOSIS — Z510 Encounter for antineoplastic radiation therapy: Secondary | ICD-10-CM | POA: Insufficient documentation

## 2017-04-09 DIAGNOSIS — C50212 Malignant neoplasm of upper-inner quadrant of left female breast: Secondary | ICD-10-CM | POA: Diagnosis not present

## 2017-04-10 ENCOUNTER — Ambulatory Visit
Admission: RE | Admit: 2017-04-10 | Discharge: 2017-04-10 | Disposition: A | Discharge status: Home / Self Care | Payer: Medicare Other | Source: Ambulatory Visit | Attending: Radiation Oncology | Admitting: Radiation Oncology

## 2017-04-10 DIAGNOSIS — C50212 Malignant neoplasm of upper-inner quadrant of left female breast: Secondary | ICD-10-CM | POA: Diagnosis not present

## 2017-04-11 ENCOUNTER — Ambulatory Visit
Admission: RE | Admit: 2017-04-11 | Discharge: 2017-04-11 | Disposition: A | Discharge status: Home / Self Care | Payer: Medicare Other | Source: Ambulatory Visit | Attending: Radiation Oncology | Admitting: Radiation Oncology

## 2017-04-11 DIAGNOSIS — C50212 Malignant neoplasm of upper-inner quadrant of left female breast: Secondary | ICD-10-CM | POA: Diagnosis not present

## 2017-04-12 ENCOUNTER — Ambulatory Visit
Admission: RE | Admit: 2017-04-12 | Discharge: 2017-04-12 | Disposition: A | Discharge status: Home / Self Care | Payer: Medicare Other | Source: Ambulatory Visit | Attending: Radiation Oncology | Admitting: Radiation Oncology

## 2017-04-12 DIAGNOSIS — C50212 Malignant neoplasm of upper-inner quadrant of left female breast: Secondary | ICD-10-CM | POA: Diagnosis not present

## 2017-04-13 ENCOUNTER — Ambulatory Visit
Admission: RE | Admit: 2017-04-13 | Discharge: 2017-04-13 | Disposition: A | Discharge status: Home / Self Care | Payer: Medicare Other | Source: Ambulatory Visit | Attending: Radiation Oncology | Admitting: Radiation Oncology

## 2017-04-13 DIAGNOSIS — C50212 Malignant neoplasm of upper-inner quadrant of left female breast: Secondary | ICD-10-CM | POA: Diagnosis not present

## 2017-04-16 ENCOUNTER — Ambulatory Visit
Admission: RE | Admit: 2017-04-16 | Discharge: 2017-04-16 | Disposition: A | Discharge status: Home / Self Care | Payer: Medicare Other | Source: Ambulatory Visit | Attending: Radiation Oncology | Admitting: Radiation Oncology

## 2017-04-16 DIAGNOSIS — C50212 Malignant neoplasm of upper-inner quadrant of left female breast: Secondary | ICD-10-CM | POA: Diagnosis not present

## 2017-04-17 ENCOUNTER — Ambulatory Visit
Admission: RE | Admit: 2017-04-17 | Discharge: 2017-04-17 | Disposition: A | Discharge status: Home / Self Care | Payer: Medicare Other | Source: Ambulatory Visit | Attending: Radiation Oncology | Admitting: Radiation Oncology

## 2017-04-17 DIAGNOSIS — C50212 Malignant neoplasm of upper-inner quadrant of left female breast: Secondary | ICD-10-CM | POA: Diagnosis not present

## 2017-04-18 ENCOUNTER — Ambulatory Visit
Admission: RE | Admit: 2017-04-18 | Discharge: 2017-04-18 | Disposition: A | Discharge status: Home / Self Care | Payer: Medicare Other | Source: Ambulatory Visit | Attending: Radiation Oncology | Admitting: Radiation Oncology

## 2017-04-18 DIAGNOSIS — C50212 Malignant neoplasm of upper-inner quadrant of left female breast: Secondary | ICD-10-CM | POA: Diagnosis not present

## 2017-04-19 ENCOUNTER — Ambulatory Visit
Admission: RE | Admit: 2017-04-19 | Discharge: 2017-04-19 | Disposition: A | Discharge status: Home / Self Care | Payer: Medicare Other | Source: Ambulatory Visit | Attending: Radiation Oncology | Admitting: Radiation Oncology

## 2017-04-19 DIAGNOSIS — C50212 Malignant neoplasm of upper-inner quadrant of left female breast: Secondary | ICD-10-CM | POA: Diagnosis not present

## 2017-04-20 ENCOUNTER — Inpatient Hospital Stay: Payer: Medicare Other | Attending: Hematology and Oncology

## 2017-04-20 ENCOUNTER — Ambulatory Visit
Admission: RE | Admit: 2017-04-20 | Discharge: 2017-04-20 | Disposition: A | Discharge status: Home / Self Care | Payer: Medicare Other | Source: Ambulatory Visit | Attending: Radiation Oncology | Admitting: Radiation Oncology

## 2017-04-20 DIAGNOSIS — Z171 Estrogen receptor negative status [ER-]: Secondary | ICD-10-CM | POA: Diagnosis not present

## 2017-04-20 DIAGNOSIS — C50212 Malignant neoplasm of upper-inner quadrant of left female breast: Secondary | ICD-10-CM | POA: Insufficient documentation

## 2017-04-20 LAB — CBC
HCT: 34.6 % — ABNORMAL LOW (ref 35.0–47.0)
HEMOGLOBIN: 11.8 g/dL — AB (ref 12.0–16.0)
MCH: 31.2 pg (ref 26.0–34.0)
MCHC: 34.2 g/dL (ref 32.0–36.0)
MCV: 91.2 fL (ref 80.0–100.0)
PLATELETS: 183 10*3/uL (ref 150–440)
RBC: 3.79 MIL/uL — AB (ref 3.80–5.20)
RDW: 13.5 % (ref 11.5–14.5)
WBC: 4.9 10*3/uL (ref 3.6–11.0)

## 2017-04-23 ENCOUNTER — Ambulatory Visit
Admission: RE | Admit: 2017-04-23 | Discharge: 2017-04-23 | Disposition: A | Discharge status: Home / Self Care | Payer: Medicare Other | Source: Ambulatory Visit | Attending: Radiation Oncology | Admitting: Radiation Oncology

## 2017-04-23 DIAGNOSIS — C50212 Malignant neoplasm of upper-inner quadrant of left female breast: Secondary | ICD-10-CM | POA: Diagnosis not present

## 2017-04-24 ENCOUNTER — Ambulatory Visit
Admission: RE | Admit: 2017-04-24 | Discharge: 2017-04-24 | Disposition: A | Discharge status: Home / Self Care | Payer: Medicare Other | Source: Ambulatory Visit | Attending: Radiation Oncology | Admitting: Radiation Oncology

## 2017-04-24 DIAGNOSIS — C50212 Malignant neoplasm of upper-inner quadrant of left female breast: Secondary | ICD-10-CM | POA: Diagnosis not present

## 2017-04-25 ENCOUNTER — Ambulatory Visit
Admission: RE | Admit: 2017-04-25 | Discharge: 2017-04-25 | Disposition: A | Discharge status: Home / Self Care | Payer: Medicare Other | Source: Ambulatory Visit | Attending: Radiation Oncology | Admitting: Radiation Oncology

## 2017-04-25 DIAGNOSIS — C50212 Malignant neoplasm of upper-inner quadrant of left female breast: Secondary | ICD-10-CM | POA: Diagnosis not present

## 2017-04-26 ENCOUNTER — Ambulatory Visit
Admission: RE | Admit: 2017-04-26 | Discharge: 2017-04-26 | Disposition: A | Discharge status: Home / Self Care | Payer: Medicare Other | Source: Ambulatory Visit | Attending: Radiation Oncology | Admitting: Radiation Oncology

## 2017-04-26 DIAGNOSIS — C50212 Malignant neoplasm of upper-inner quadrant of left female breast: Secondary | ICD-10-CM | POA: Diagnosis not present

## 2017-04-27 ENCOUNTER — Ambulatory Visit
Admission: RE | Admit: 2017-04-27 | Discharge: 2017-04-27 | Disposition: A | Discharge status: Home / Self Care | Payer: Medicare Other | Source: Ambulatory Visit | Attending: Radiation Oncology | Admitting: Radiation Oncology

## 2017-04-27 DIAGNOSIS — C50212 Malignant neoplasm of upper-inner quadrant of left female breast: Secondary | ICD-10-CM | POA: Diagnosis not present

## 2017-04-30 ENCOUNTER — Ambulatory Visit
Admission: RE | Admit: 2017-04-30 | Discharge: 2017-04-30 | Disposition: A | Discharge status: Home / Self Care | Payer: Medicare Other | Source: Ambulatory Visit | Attending: Radiation Oncology | Admitting: Radiation Oncology

## 2017-04-30 DIAGNOSIS — Z510 Encounter for antineoplastic radiation therapy: Secondary | ICD-10-CM | POA: Insufficient documentation

## 2017-04-30 DIAGNOSIS — C50212 Malignant neoplasm of upper-inner quadrant of left female breast: Secondary | ICD-10-CM | POA: Diagnosis not present

## 2017-04-30 DIAGNOSIS — Z170 Estrogen receptor positive status [ER+]: Secondary | ICD-10-CM | POA: Diagnosis not present

## 2017-05-01 ENCOUNTER — Ambulatory Visit
Admission: RE | Admit: 2017-05-01 | Discharge: 2017-05-01 | Disposition: A | Discharge status: Home / Self Care | Payer: Medicare Other | Source: Ambulatory Visit | Attending: Radiation Oncology | Admitting: Radiation Oncology

## 2017-05-01 DIAGNOSIS — C50212 Malignant neoplasm of upper-inner quadrant of left female breast: Secondary | ICD-10-CM | POA: Diagnosis not present

## 2017-05-02 ENCOUNTER — Ambulatory Visit
Admission: RE | Admit: 2017-05-02 | Discharge: 2017-05-02 | Disposition: A | Discharge status: Home / Self Care | Payer: Medicare Other | Source: Ambulatory Visit | Attending: Radiation Oncology | Admitting: Radiation Oncology

## 2017-05-02 DIAGNOSIS — C50212 Malignant neoplasm of upper-inner quadrant of left female breast: Secondary | ICD-10-CM | POA: Diagnosis not present

## 2017-05-03 ENCOUNTER — Ambulatory Visit
Admission: RE | Admit: 2017-05-03 | Discharge: 2017-05-03 | Disposition: A | Discharge status: Home / Self Care | Payer: Medicare Other | Source: Ambulatory Visit | Attending: Radiation Oncology | Admitting: Radiation Oncology

## 2017-05-03 DIAGNOSIS — C50212 Malignant neoplasm of upper-inner quadrant of left female breast: Secondary | ICD-10-CM | POA: Diagnosis not present

## 2017-05-04 ENCOUNTER — Inpatient Hospital Stay: Payer: Medicare Other | Attending: Hematology and Oncology

## 2017-05-04 ENCOUNTER — Ambulatory Visit
Admission: RE | Admit: 2017-05-04 | Discharge: 2017-05-04 | Disposition: A | Discharge status: Home / Self Care | Payer: Medicare Other | Source: Ambulatory Visit | Attending: Radiation Oncology | Admitting: Radiation Oncology

## 2017-05-04 DIAGNOSIS — C50212 Malignant neoplasm of upper-inner quadrant of left female breast: Secondary | ICD-10-CM | POA: Diagnosis present

## 2017-05-04 DIAGNOSIS — Z171 Estrogen receptor negative status [ER-]: Secondary | ICD-10-CM | POA: Insufficient documentation

## 2017-05-04 LAB — CBC
HCT: 34.8 % — ABNORMAL LOW (ref 35.0–47.0)
HEMOGLOBIN: 11.8 g/dL — AB (ref 12.0–16.0)
MCH: 30.8 pg (ref 26.0–34.0)
MCHC: 33.9 g/dL (ref 32.0–36.0)
MCV: 91 fL (ref 80.0–100.0)
PLATELETS: 176 10*3/uL (ref 150–440)
RBC: 3.83 MIL/uL (ref 3.80–5.20)
RDW: 13.4 % (ref 11.5–14.5)
WBC: 5.7 10*3/uL (ref 3.6–11.0)

## 2017-05-07 ENCOUNTER — Ambulatory Visit
Admission: RE | Admit: 2017-05-07 | Discharge: 2017-05-07 | Disposition: A | Discharge status: Home / Self Care | Payer: Medicare Other | Source: Ambulatory Visit | Attending: Radiation Oncology | Admitting: Radiation Oncology

## 2017-05-07 DIAGNOSIS — C50212 Malignant neoplasm of upper-inner quadrant of left female breast: Secondary | ICD-10-CM | POA: Diagnosis not present

## 2017-05-08 ENCOUNTER — Ambulatory Visit
Admission: RE | Admit: 2017-05-08 | Discharge: 2017-05-08 | Disposition: A | Discharge status: Home / Self Care | Payer: Medicare Other | Source: Ambulatory Visit | Attending: Radiation Oncology | Admitting: Radiation Oncology

## 2017-05-08 DIAGNOSIS — C50212 Malignant neoplasm of upper-inner quadrant of left female breast: Secondary | ICD-10-CM | POA: Diagnosis not present

## 2017-06-13 ENCOUNTER — Ambulatory Visit: Payer: Medicare Other | Admitting: Radiation Oncology

## 2017-06-14 ENCOUNTER — Other Ambulatory Visit: Payer: Self-pay

## 2017-06-14 ENCOUNTER — Ambulatory Visit
Admission: RE | Admit: 2017-06-14 | Discharge: 2017-06-14 | Disposition: A | Discharge status: Home / Self Care | Payer: Medicare Other | Source: Ambulatory Visit | Attending: Radiation Oncology | Admitting: Radiation Oncology

## 2017-06-14 ENCOUNTER — Encounter: Payer: Self-pay | Admitting: Radiation Oncology

## 2017-06-14 VITALS — BP 122/67 | HR 63 | Temp 97.0°F | Resp 20 | Wt 165.5 lb

## 2017-06-14 DIAGNOSIS — Z170 Estrogen receptor positive status [ER+]: Secondary | ICD-10-CM

## 2017-06-14 DIAGNOSIS — Z171 Estrogen receptor negative status [ER-]: Secondary | ICD-10-CM | POA: Diagnosis not present

## 2017-06-14 DIAGNOSIS — Z923 Personal history of irradiation: Secondary | ICD-10-CM | POA: Insufficient documentation

## 2017-06-14 DIAGNOSIS — C50912 Malignant neoplasm of unspecified site of left female breast: Secondary | ICD-10-CM | POA: Diagnosis present

## 2017-06-14 DIAGNOSIS — C50212 Malignant neoplasm of upper-inner quadrant of left female breast: Secondary | ICD-10-CM

## 2017-06-14 NOTE — Progress Notes (Signed)
Radiation Oncology Follow up Note  Name: Joanne Collier   Date:   06/14/2017 MRN:  [id number] DOB: 1940/11/12    This 77 y.o. female presents to the clinic today for one-month follow-up status post whole breast radiation to her left breast for stage I ER negative PR slightly positive invasive mammary carcinoma.  REFERRING PROVIDER: Derinda Late, MD  HPI: patient is a 77 year old female now out 1 month having completed whole breast radiation to her left breast for a invasive mammary carcinoma ER negative PR slightly positive and HER-2/neu not overexpressed. Seen today in routine follow she is doing well. She specifically denies breast tenderness cough or bone pain. She is not seen medical oncology we'll arranging that the next week or 2. She will have a discussion about antiestrogen therapy at that time..  COMPLICATIONS OF TREATMENT: none  FOLLOW UP COMPLIANCE: keeps appointments   PHYSICAL EXAM:  BP 122/67   Pulse 63   Temp (!) 97 F (36.1 C)   Resp 20   Wt 165 lb 7.3 oz (75 kg)   BMI 26.71 kg/m  Lungs are clear to A&P cardiac examination essentially unremarkable with regular rate and rhythm. No dominant mass or nodularity is noted in either breast in 2 positions examined. Incision is well-healed. No axillary or supraclavicular adenopathy is appreciated. Cosmetic result is excellent. Well-developed well-nourished patient in NAD. HEENT reveals PERLA, EOMI, discs not visualized.  Oral cavity is clear. No oral mucosal lesions are identified. Neck is clear without evidence of cervical or supraclavicular adenopathy. Lungs are clear to A&P. Cardiac examination is essentially unremarkable with regular rate and rhythm without murmur rub or thrill. Abdomen is benign with no organomegaly or masses noted. Motor sensory and DTR levels are equal and symmetric in the upper and lower extremities. Cranial nerves II through XII are grossly intact. Proprioception is intact. No peripheral adenopathy or edema is  identified. No motor or sensory levels are noted. Crude visual fields are within normal range.  RADIOLOGY RESULTS: no current films for review  PLAN: present time patient is doing well 1 month out of external beam radiation therapy. I'm please were overall progress. I've asked to see her back in 4-5 months for follow-up. I have scheduled appointment with Dr. Mike Gip for follow-up and discussion of antiestrogen therapy. Patient knows to call with any concerns.  I would like to take this opportunity to thank you for allowing me to participate in the care of your patient.Noreene Filbert, MD

## 2017-07-05 ENCOUNTER — Encounter: Payer: Self-pay | Admitting: Hematology and Oncology

## 2017-07-05 ENCOUNTER — Inpatient Hospital Stay: Payer: Medicare Other | Attending: Hematology and Oncology | Admitting: Hematology and Oncology

## 2017-07-05 VITALS — BP 122/71 | HR 80 | Temp 97.6°F | Resp 16 | Wt 166.1 lb

## 2017-07-05 DIAGNOSIS — C50212 Malignant neoplasm of upper-inner quadrant of left female breast: Secondary | ICD-10-CM | POA: Insufficient documentation

## 2017-07-05 DIAGNOSIS — Z7982 Long term (current) use of aspirin: Secondary | ICD-10-CM | POA: Diagnosis not present

## 2017-07-05 DIAGNOSIS — Z800 Family history of malignant neoplasm of digestive organs: Secondary | ICD-10-CM | POA: Diagnosis not present

## 2017-07-05 DIAGNOSIS — I10 Essential (primary) hypertension: Secondary | ICD-10-CM

## 2017-07-05 DIAGNOSIS — Z923 Personal history of irradiation: Secondary | ICD-10-CM

## 2017-07-05 DIAGNOSIS — Z8041 Family history of malignant neoplasm of ovary: Secondary | ICD-10-CM | POA: Diagnosis not present

## 2017-07-05 DIAGNOSIS — H409 Unspecified glaucoma: Secondary | ICD-10-CM | POA: Insufficient documentation

## 2017-07-05 DIAGNOSIS — M1990 Unspecified osteoarthritis, unspecified site: Secondary | ICD-10-CM | POA: Diagnosis not present

## 2017-07-05 DIAGNOSIS — Z171 Estrogen receptor negative status [ER-]: Secondary | ICD-10-CM | POA: Insufficient documentation

## 2017-07-05 DIAGNOSIS — Z79899 Other long term (current) drug therapy: Secondary | ICD-10-CM | POA: Insufficient documentation

## 2017-07-05 MED ORDER — LETROZOLE 2.5 MG PO TABS: 2.5000 mg | ORAL_TABLET | Freq: Every day | ORAL | 0 refills | Status: DC
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
## 2017-07-05 NOTE — Patient Instructions (Signed)
Letrozole tablets What is this medicine? LETROZOLE (LET roe zole) blocks the production of estrogen. It is used to treat breast cancer. This medicine may be used for other purposes; ask your health care provider or pharmacist if you have questions. COMMON BRAND NAME(S): Femara What should I tell my health care provider before I take this medicine? They need to know if you have any of these conditions: -high cholesterol -liver disease -osteoporosis (weak bones) -an unusual or allergic reaction to letrozole, other medicines, foods, dyes, or preservatives -pregnant or trying to get pregnant -breast-feeding How should I use this medicine? Take this medicine by mouth with a glass of water. You may take it with or without food. Follow the directions on the prescription label. Take your medicine at regular intervals. Do not take your medicine more often than directed. Do not stop taking except on your doctor's advice. Talk to your pediatrician regarding the use of this medicine in children. Special care may be needed. Overdosage: If you think you have taken too much of this medicine contact a poison control center or emergency room at once. NOTE: This medicine is only for you. Do not share this medicine with others. What if I miss a dose? If you miss a dose, take it as soon as you can. If it is almost time for your next dose, take only that dose. Do not take double or extra doses. What may interact with this medicine? Do not take this medicine with any of the following medications: -estrogens, like hormone replacement therapy or birth control pills This medicine may also interact with the following medications: -dietary supplements such as androstenedione or DHEA -prasterone -tamoxifen This list may not describe all possible interactions. Give your health care provider a list of all the medicines, herbs, non-prescription drugs, or dietary supplements you use. Also tell them if you smoke, drink  alcohol, or use illegal drugs. Some items may interact with your medicine. What should I watch for while using this medicine? Tell your doctor or healthcare professional if your symptoms do not start to get better or if they get worse. Do not become pregnant while taking this medicine or for 3 weeks after stopping it. Women should inform their doctor if they wish to become pregnant or think they might be pregnant. There is a potential for serious side effects to an unborn child. Talk to your health care professional or pharmacist for more information. Do not breast-feed while taking this medicine or for 3 weeks after stopping it. This medicine may interfere with the ability to have a child. Talk with your doctor or health care professional if you are concerned about your fertility. Using this medicine for a long time may increase your risk of low bone mass. Talk to your doctor about bone health. You may get drowsy or dizzy. Do not drive, use machinery, or do anything that needs mental alertness until you know how this medicine affects you. Do not stand or sit up quickly, especially if you are an older patient. This reduces the risk of dizzy or fainting spells. You may need blood work done while you are taking this medicine. What side effects may I notice from receiving this medicine? Side effects that you should report to your doctor or health care professional as soon as possible: -allergic reactions like skin rash, itching, or hives -bone fracture -chest pain -signs and symptoms of a blood clot such as breathing problems; changes in vision; chest pain; severe, sudden headache; pain, swelling,   warmth in the leg; trouble speaking; sudden numbness or weakness of the face, arm or leg -vaginal bleeding Side effects that usually do not require medical attention (report to your doctor or health care professional if they continue or are bothersome): -bone, back, joint, or muscle  pain -dizziness -fatigue -fluid retention -headache -hot flashes, night sweats -nausea -weight gain This list may not describe all possible side effects. Call your doctor for medical advice about side effects. You may report side effects to FDA at 1-800-FDA-1088. Where should I keep my medicine? Keep out of the reach of children. Store between 15 and 30 degrees C (59 and 86 degrees F). Throw away any unused medicine after the expiration date. NOTE: This sheet is a summary. It may not cover all possible information. If you have questions about this medicine, talk to your doctor, pharmacist, or health care provider.  2018 Elsevier/Gold Standard (2015-08-23 11:10:41)  

## 2017-07-05 NOTE — Progress Notes (Signed)
Pt in for follow up.  Denies any difficulties or concerns.  States "I'm doing good."
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
## 2017-08-06 ENCOUNTER — Inpatient Hospital Stay: Payer: Medicare Other | Attending: Hematology and Oncology

## 2017-08-06 ENCOUNTER — Inpatient Hospital Stay (HOSPITAL_BASED_OUTPATIENT_CLINIC_OR_DEPARTMENT_OTHER): Payer: Medicare Other | Admitting: Hematology and Oncology

## 2017-08-06 ENCOUNTER — Encounter: Payer: Self-pay | Admitting: Hematology and Oncology

## 2017-08-06 VITALS — BP 129/73 | HR 62 | Temp 98.4°F | Resp 16 | Wt 167.0 lb

## 2017-08-06 DIAGNOSIS — C50212 Malignant neoplasm of upper-inner quadrant of left female breast: Secondary | ICD-10-CM | POA: Insufficient documentation

## 2017-08-06 DIAGNOSIS — Z800 Family history of malignant neoplasm of digestive organs: Secondary | ICD-10-CM

## 2017-08-06 DIAGNOSIS — Z923 Personal history of irradiation: Secondary | ICD-10-CM | POA: Insufficient documentation

## 2017-08-06 DIAGNOSIS — Z170 Estrogen receptor positive status [ER+]: Secondary | ICD-10-CM | POA: Insufficient documentation

## 2017-08-06 DIAGNOSIS — Z79811 Long term (current) use of aromatase inhibitors: Secondary | ICD-10-CM

## 2017-08-06 DIAGNOSIS — Z79899 Other long term (current) drug therapy: Secondary | ICD-10-CM | POA: Diagnosis not present

## 2017-08-06 DIAGNOSIS — M1712 Unilateral primary osteoarthritis, left knee: Secondary | ICD-10-CM | POA: Diagnosis not present

## 2017-08-06 DIAGNOSIS — I10 Essential (primary) hypertension: Secondary | ICD-10-CM | POA: Diagnosis not present

## 2017-08-06 DIAGNOSIS — Z7982 Long term (current) use of aspirin: Secondary | ICD-10-CM

## 2017-08-06 DIAGNOSIS — Z171 Estrogen receptor negative status [ER-]: Secondary | ICD-10-CM

## 2017-08-06 DIAGNOSIS — H409 Unspecified glaucoma: Secondary | ICD-10-CM

## 2017-08-06 DIAGNOSIS — Z8041 Family history of malignant neoplasm of ovary: Secondary | ICD-10-CM | POA: Diagnosis not present

## 2017-08-06 LAB — CBC WITH DIFFERENTIAL/PLATELET
Basophils Absolute: 0 10*3/uL (ref 0–0.1)
Basophils Relative: 1 %
Eosinophils Absolute: 0.1 10*3/uL (ref 0–0.7)
Eosinophils Relative: 1 %
HCT: 33 % — ABNORMAL LOW (ref 35.0–47.0)
Hemoglobin: 11.2 g/dL — ABNORMAL LOW (ref 12.0–16.0)
Lymphocytes Relative: 18 %
Lymphs Abs: 1 10*3/uL (ref 1.0–3.6)
MCH: 31.3 pg (ref 26.0–34.0)
MCHC: 33.8 g/dL (ref 32.0–36.0)
MCV: 92.4 fL (ref 80.0–100.0)
Monocytes Absolute: 0.3 10*3/uL (ref 0.2–0.9)
Monocytes Relative: 5 %
Neutro Abs: 4.3 10*3/uL (ref 1.4–6.5)
Neutrophils Relative %: 75 %
Platelets: 187 10*3/uL (ref 150–440)
RBC: 3.57 MIL/uL — ABNORMAL LOW (ref 3.80–5.20)
RDW: 13.3 % (ref 11.5–14.5)
WBC: 5.6 10*3/uL (ref 3.6–11.0)

## 2017-08-06 LAB — COMPREHENSIVE METABOLIC PANEL
ALT: 10 U/L (ref 0–44)
AST: 21 U/L (ref 15–41)
Albumin: 3.4 g/dL — ABNORMAL LOW (ref 3.5–5.0)
Alkaline Phosphatase: 94 U/L (ref 38–126)
Anion gap: 8 (ref 5–15)
BUN: 15 mg/dL (ref 8–23)
CO2: 23 mmol/L (ref 22–32)
Calcium: 8.8 mg/dL — ABNORMAL LOW (ref 8.9–10.3)
Chloride: 107 mmol/L (ref 98–111)
Creatinine, Ser: 0.84 mg/dL (ref 0.44–1.00)
GFR calc Af Amer: >60 mL/min (ref >60)
GFR calc non Af Amer: >60 mL/min (ref >60)
Glucose, Bld: 146 mg/dL — ABNORMAL HIGH (ref 70–99)
Potassium: 4 mmol/L (ref 3.5–5.1)
Sodium: 138 mmol/L (ref 135–145)
Total Bilirubin: 0.5 mg/dL (ref 0.3–1.2)
Total Protein: 8 g/dL (ref 6.5–8.1)

## 2017-08-06 MED ORDER — LETROZOLE 2.5 MG PO TABS: 2.5000 mg | ORAL_TABLET | Freq: Every day | ORAL | 2 refills | Status: DC

## 2017-08-07 ENCOUNTER — Encounter (INDEPENDENT_AMBULATORY_CARE_PROVIDER_SITE_OTHER): Payer: Medicare Other | Admitting: Ophthalmology

## 2017-08-07 DIAGNOSIS — I10 Essential (primary) hypertension: Secondary | ICD-10-CM

## 2017-08-07 DIAGNOSIS — H43811 Vitreous degeneration, right eye: Secondary | ICD-10-CM | POA: Diagnosis not present

## 2017-08-07 DIAGNOSIS — H35033 Hypertensive retinopathy, bilateral: Secondary | ICD-10-CM

## 2017-08-07 DIAGNOSIS — H35373 Puckering of macula, bilateral: Secondary | ICD-10-CM

## 2017-08-07 LAB — CA 27.29 (SERIAL MONITOR): CA 27.29: 10.1 U/mL (ref 0.0–38.6)

## 2017-11-06 ENCOUNTER — Inpatient Hospital Stay: Payer: Medicare Other | Admitting: Hematology and Oncology

## 2017-11-06 ENCOUNTER — Inpatient Hospital Stay: Payer: Medicare Other | Attending: Hematology and Oncology
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
## 2017-11-15 ENCOUNTER — Encounter: Payer: Self-pay | Admitting: Radiation Oncology

## 2017-11-15 ENCOUNTER — Ambulatory Visit
Admission: RE | Admit: 2017-11-15 | Discharge: 2017-11-15 | Disposition: A | Discharge status: Home / Self Care | Payer: Medicare Other | Source: Ambulatory Visit | Attending: Radiation Oncology | Admitting: Radiation Oncology

## 2017-11-15 ENCOUNTER — Other Ambulatory Visit: Payer: Self-pay

## 2017-11-15 VITALS — BP 121/69 | HR 65 | Temp 97.1°F | Wt 166.3 lb

## 2017-11-15 DIAGNOSIS — Z79811 Long term (current) use of aromatase inhibitors: Secondary | ICD-10-CM | POA: Diagnosis not present

## 2017-11-15 DIAGNOSIS — Z170 Estrogen receptor positive status [ER+]: Secondary | ICD-10-CM | POA: Insufficient documentation

## 2017-11-15 DIAGNOSIS — Z923 Personal history of irradiation: Secondary | ICD-10-CM | POA: Diagnosis not present

## 2017-11-15 DIAGNOSIS — C50212 Malignant neoplasm of upper-inner quadrant of left female breast: Secondary | ICD-10-CM | POA: Insufficient documentation

## 2017-11-15 NOTE — Progress Notes (Signed)
Radiation Oncology Follow up Note  Name: Joanne Collier   Date:   11/15/2017 MRN:  [id number] DOB: Jun 12, 1940    This 77 y.o. female presents to the clinic today for 6 month follow-up status post whole breast radiation to her left breast for stage I ER negative PR slightly positive invasive mammary carcinoma.  REFERRING PROVIDER: Derinda Late, MD  HPI: patient is a 77 year old female now seen out 6 months having completed whole breast radiation to her left breast for stage I ER negative PR slightly positive invasive mammary carcinoma. Seen today in routine follow-up she is doing well. She specifically denies breast tenderness cough or bone pain..she is currently on Femara tolerating that well without side effect. She's not yet had a follow-up mammogram.  COMPLICATIONS OF TREATMENT: none  FOLLOW UP COMPLIANCE: keeps appointments   PHYSICAL EXAM:  BP 121/69 (BP Location: Right Arm, Patient Position: Sitting)   Pulse 65   Temp (!) 97.1 F (36.2 C) (Tympanic)   Wt 166 lb 5.4 oz (75.4 kg)   BMI 26.85 kg/m  Lungs are clear to A&P cardiac examination essentially unremarkable with regular rate and rhythm. No dominant mass or nodularity is noted in either breast in 2 positions examined. Incision is well-healed. No axillary or supraclavicular adenopathy is appreciated. Cosmetic result is excellent.Well-developed well-nourished patient in NAD. HEENT reveals PERLA, EOMI, discs not visualized.  Oral cavity is clear. No oral mucosal lesions are identified. Neck is clear without evidence of cervical or supraclavicular adenopathy. Lungs are clear to A&P. Cardiac examination is essentially unremarkable with regular rate and rhythm without murmur rub or thrill. Abdomen is benign with no organomegaly or masses noted. Motor sensory and DTR levels are equal and symmetric in the upper and lower extremities. Cranial nerves II through XII are grossly intact. Proprioception is intact. No peripheral adenopathy or edema  is identified. No motor or sensory levels are noted. Crude visual fields are within normal range.  RADIOLOGY RESULTS: no current films for review  PLAN: present time she is doing well with no evidence of disease. I'm please were overall progress. She continues on Femara without side effect. She will have mammograms ordered in the near future. Patient is to call with any concerns. She continues close follow-up care with medical oncology.  I would like to take this opportunity to thank you for allowing me to participate in the care of your patient.Noreene Filbert, MD

## 2017-12-04 ENCOUNTER — Other Ambulatory Visit: Payer: Self-pay | Admitting: Obstetrics and Gynecology

## 2017-12-04 DIAGNOSIS — Z1231 Encounter for screening mammogram for malignant neoplasm of breast: Secondary | ICD-10-CM

## 2018-01-14 ENCOUNTER — Ambulatory Visit
Admission: RE | Admit: 2018-01-14 | Discharge: 2018-01-14 | Disposition: A | Discharge status: Home / Self Care | Payer: Medicare Other | Source: Ambulatory Visit | Attending: Obstetrics and Gynecology | Admitting: Obstetrics and Gynecology

## 2018-01-14 DIAGNOSIS — Z1231 Encounter for screening mammogram for malignant neoplasm of breast: Secondary | ICD-10-CM

## 2018-01-14 DIAGNOSIS — Z853 Personal history of malignant neoplasm of breast: Secondary | ICD-10-CM | POA: Insufficient documentation

## 2018-04-09 ENCOUNTER — Inpatient Hospital Stay: Payer: Medicare Other | Attending: Internal Medicine

## 2018-04-09 ENCOUNTER — Inpatient Hospital Stay (HOSPITAL_BASED_OUTPATIENT_CLINIC_OR_DEPARTMENT_OTHER): Payer: Medicare Other | Admitting: Internal Medicine

## 2018-04-09 ENCOUNTER — Other Ambulatory Visit: Payer: Self-pay

## 2018-04-09 ENCOUNTER — Encounter: Payer: Self-pay | Admitting: Internal Medicine

## 2018-04-09 VITALS — BP 169/85 | HR 63 | Temp 97.6°F | Resp 16 | Wt 174.0 lb

## 2018-04-09 DIAGNOSIS — Z170 Estrogen receptor positive status [ER+]: Secondary | ICD-10-CM

## 2018-04-09 DIAGNOSIS — Z800 Family history of malignant neoplasm of digestive organs: Secondary | ICD-10-CM | POA: Insufficient documentation

## 2018-04-09 DIAGNOSIS — H409 Unspecified glaucoma: Secondary | ICD-10-CM | POA: Insufficient documentation

## 2018-04-09 DIAGNOSIS — C50212 Malignant neoplasm of upper-inner quadrant of left female breast: Secondary | ICD-10-CM | POA: Insufficient documentation

## 2018-04-09 DIAGNOSIS — D649 Anemia, unspecified: Secondary | ICD-10-CM | POA: Diagnosis not present

## 2018-04-09 DIAGNOSIS — Z79899 Other long term (current) drug therapy: Secondary | ICD-10-CM

## 2018-04-09 DIAGNOSIS — Z791 Long term (current) use of non-steroidal anti-inflammatories (NSAID): Secondary | ICD-10-CM | POA: Insufficient documentation

## 2018-04-09 DIAGNOSIS — Z79811 Long term (current) use of aromatase inhibitors: Secondary | ICD-10-CM | POA: Insufficient documentation

## 2018-04-09 DIAGNOSIS — M1990 Unspecified osteoarthritis, unspecified site: Secondary | ICD-10-CM

## 2018-04-09 DIAGNOSIS — Z7982 Long term (current) use of aspirin: Secondary | ICD-10-CM | POA: Diagnosis not present

## 2018-04-09 DIAGNOSIS — Z8041 Family history of malignant neoplasm of ovary: Secondary | ICD-10-CM

## 2018-04-09 DIAGNOSIS — I10 Essential (primary) hypertension: Secondary | ICD-10-CM

## 2018-04-09 LAB — CBC WITH DIFFERENTIAL/PLATELET
Abs Immature Granulocytes: 0.02 10*3/uL (ref 0.00–0.07)
Basophils Absolute: 0 10*3/uL (ref 0.0–0.1)
Basophils Relative: 0 %
EOS PCT: 1 %
Eosinophils Absolute: 0.1 10*3/uL (ref 0.0–0.5)
HCT: 34.1 % — ABNORMAL LOW (ref 36.0–46.0)
Hemoglobin: 11.2 g/dL — ABNORMAL LOW (ref 12.0–15.0)
Immature Granulocytes: 0 %
Lymphocytes Relative: 28 %
Lymphs Abs: 1.5 10*3/uL (ref 0.7–4.0)
MCH: 30.2 pg (ref 26.0–34.0)
MCHC: 32.8 g/dL (ref 30.0–36.0)
MCV: 91.9 fL (ref 80.0–100.0)
Monocytes Absolute: 0.4 10*3/uL (ref 0.1–1.0)
Monocytes Relative: 8 %
Neutro Abs: 3.5 10*3/uL (ref 1.7–7.7)
Neutrophils Relative %: 63 %
PLATELETS: 205 10*3/uL (ref 150–400)
RBC: 3.71 MIL/uL — ABNORMAL LOW (ref 3.87–5.11)
RDW: 12.7 % (ref 11.5–15.5)
WBC: 5.5 10*3/uL (ref 4.0–10.5)
nRBC: 0 % (ref 0.0–0.2)

## 2018-04-09 LAB — COMPREHENSIVE METABOLIC PANEL
ALBUMIN: 3.8 g/dL (ref 3.5–5.0)
ALK PHOS: 94 U/L (ref 38–126)
ALT: 12 U/L (ref 0–44)
ANION GAP: 6 (ref 5–15)
AST: 16 U/L (ref 15–41)
BILIRUBIN TOTAL: 0.5 mg/dL (ref 0.3–1.2)
BUN: 18 mg/dL (ref 8–23)
CALCIUM: 9 mg/dL (ref 8.9–10.3)
CO2: 27 mmol/L (ref 22–32)
Chloride: 107 mmol/L (ref 98–111)
Creatinine, Ser: 0.8 mg/dL (ref 0.44–1.00)
GFR calc Af Amer: >60 mL/min (ref >60)
GLUCOSE: 94 mg/dL (ref 70–99)
POTASSIUM: 4.1 mmol/L (ref 3.5–5.1)
Sodium: 140 mmol/L (ref 135–145)
TOTAL PROTEIN: 8.4 g/dL — AB (ref 6.5–8.1)

## 2018-04-09 NOTE — Progress Notes (Signed)
[facility] CONSULT NOTE  Patient Care Team: Derinda Late, MD as PCP - General (Family Medicine) Schermerhorn, Gwen Her, MD as Referring Physician (Obstetrics and Gynecology) Leonie Green, MD as Referring Physician (Surgery)  CHIEF COMPLAINTS/PURPOSE OF CONSULTATION:  Breast cancer  #  Oncology History   #Left breast-invasive mammary carcinoma-ER-NEG; PR positive HER-2/neu negative.  Stage I status post lumpectomy [Dr. Burnett] followed by radiation; currently on aromatase inhibitor  #March 2019 bone density normal limits     Carcinoma of upper-inner quadrant of left breast in female, estrogen receptor positive ([place])   02/01/2017 Initial Diagnosis    Carcinoma of upper-inner quadrant of left breast in female, estrogen receptor positive ([place])    This is my first interaction with the patient as patient's primary oncologist has been [facility].  I reviewed the patient's prior charts/pertinent labs/imaging in detail; findings are summarized above.    HISTORY OF PRESENTING ILLNESS:  Joanne Collier 78 y.o.  female stage I ER PR positive breast cancer currently on aromatase inhibitor is here for follow-up.  Patient denies any bone pain.  Any significant hot flashes.  No nausea no vomiting.  Headaches.   Review of Systems  Constitutional: Negative for chills, diaphoresis, fever, malaise/fatigue and weight loss.  HENT: Negative for nosebleeds and sore throat.   Eyes: Negative for double vision.  Respiratory: Negative for cough, hemoptysis, sputum production, shortness of breath and wheezing.   Cardiovascular: Negative for chest pain, palpitations, orthopnea and leg swelling.  Gastrointestinal: Negative for abdominal pain, blood in stool, constipation, diarrhea, heartburn, melena, nausea and vomiting.  Genitourinary: Negative for dysuria, frequency and urgency.  Musculoskeletal: Positive for back pain and joint pain.  Skin: Negative.  Negative for itching and rash.   Neurological: Negative for dizziness, tingling, focal weakness, weakness and headaches.  Endo/Heme/Allergies: Does not bruise/bleed easily.  Psychiatric/Behavioral: Negative for depression. The patient is not nervous/anxious and does not have insomnia.      MEDICAL HISTORY:  Past Medical History:  Diagnosis Date  . Arthritis    LEFT KNEE  . Cancer ([place])   . Glaucoma   . Hypertension     SURGICAL HISTORY: Past Surgical History:  Procedure Laterality Date  . BREAST BIOPSY Left 02/01/2017   INVASIVE MAMMARY CARCINOMA bx  . BREAST EXCISIONAL BIOPSY Left 02/23/2017   lumpectomy nl and sn  . ENDOMETRIAL BIOPSY    . EYE SURGERY Bilateral   . PARTIAL MASTECTOMY WITH NEEDLE LOCALIZATION Left 02/23/2017   Procedure: PARTIAL MASTECTOMY WITH NEEDLE LOCALIZATION;  Surgeon: Leonie Green, MD;  Location: [facility] ORS;  Service: General;  Laterality: Left;  . SENTINEL NODE BIOPSY Left 02/23/2017   Procedure: SENTINEL NODE BIOPSY;  Surgeon: Leonie Green, MD;  Location: [facility] ORS;  Service: General;  Laterality: Left;    SOCIAL HISTORY: Social History   Socioeconomic History  . Marital status: Married    Spouse name: Not on file  . Number of children: Not on file  . Years of education: Not on file  . Highest education level: Not on file  Occupational History  . Not on file  Social Needs  . Financial resource strain: Not on file  . Food insecurity:    Worry: Not on file    Inability: Not on file  . Transportation needs:    Medical: Not on file    Non-medical: Not on file  Tobacco Use  . Smoking status: Never Smoker  . Smokeless tobacco: Never Used  Substance and Sexual Activity  .  Alcohol use: No    Frequency: Never  . Drug use: No  . Sexual activity: Not on file  Lifestyle  . Physical activity:    Days per week: Not on file    Minutes per session: Not on file  . Stress: Not on file  Relationships  . Social connections:    Talks on phone: Not on file    Gets  together: Not on file    Attends religious service: Not on file    Active member of club or organization: Not on file    Attends meetings of clubs or organizations: Not on file    Relationship status: Not on file  . Intimate partner violence:    Fear of current or ex partner: Not on file    Emotionally abused: Not on file    Physically abused: Not on file    Forced sexual activity: Not on file  Other Topics Concern  . Not on file  Social History Narrative  . Not on file    FAMILY HISTORY: Family History  Problem Relation Age of Onset  . Cancer Mother 28       ovarian  . Cancer Brother 25       colon  . Breast cancer Neg Hx     ALLERGIES:  has No Known Allergies.  MEDICATIONS:  Current Outpatient Medications  Medication Sig Dispense Refill  . aspirin EC 81 MG tablet Take 81 mg by mouth daily.    Marland Kitchen atenolol (TENORMIN) 25 MG tablet Take 25 mg by mouth every morning.     . cromolyn (OPTICROM) 4 % ophthalmic solution     . dorzolamide (TRUSOPT) 2 % ophthalmic solution Place 1 drop into both eyes 2 (two) times daily.    Marland Kitchen ibuprofen (ADVIL,MOTRIN) 200 MG tablet Take 200 mg by mouth every 6 (six) hours as needed for headache or moderate pain.    Marland Kitchen letrozole (FEMARA) 2.5 MG tablet Take 1 tablet (2.5 mg total) by mouth daily. 90 tablet 2  . TRAVATAN Z 0.004 % SOLN ophthalmic solution Place 1 drop into the right eye at bedtime.  1   No current facility-administered medications for this visit.       Marland Kitchen  PHYSICAL EXAMINATION: ECOG PERFORMANCE STATUS: 0 - Asymptomatic  Vitals:   04/09/18 1356  BP: (!) 169/85  Pulse: 63  Resp: 16  Temp: 97.6 F (36.4 C)   Filed Weights   04/09/18 1356  Weight: 174 lb (78.9 kg)    Physical Exam  Constitutional: She is oriented to person, place, and time and well-developed, well-nourished, and in no distress.  HENT:  Head: Normocephalic and atraumatic.  Mouth/Throat: Oropharynx is clear and moist. No oropharyngeal exudate.  Eyes:  Pupils are equal, round, and reactive to light.  Neck: Normal range of motion. Neck supple.  Cardiovascular: Normal rate and regular rhythm.  Pulmonary/Chest: No respiratory distress. She has no wheezes.  Abdominal: Soft. Bowel sounds are normal. She exhibits no distension and no mass. There is no abdominal tenderness. There is no rebound and no guarding.  Musculoskeletal: Normal range of motion.        General: No tenderness or edema.  Neurological: She is alert and oriented to person, place, and time.  Skin: Skin is warm.  Psychiatric: Affect normal.     LABORATORY DATA:  I have reviewed the data as listed Lab Results  Component Value Date   WBC 5.5 04/09/2018   HGB 11.2 (L) 04/09/2018   HCT  34.1 (L) 04/09/2018   MCV 91.9 04/09/2018   PLT 205 04/09/2018   Recent Labs    08/06/17 1003 04/09/18 1312  NA 138 140  K 4.0 4.1  CL 107 107  CO2 23 27  GLUCOSE 146* 94  BUN 15 18  CREATININE 0.84 0.80  CALCIUM 8.8* 9.0  GFRNONAA >60 >60  GFRAA >60 >60  PROT 8.0 8.4*  ALBUMIN 3.4* 3.8  AST 21 16  ALT 10 12  ALKPHOS 94 94  BILITOT 0.5 0.5    RADIOGRAPHIC STUDIES: I have personally reviewed the radiological images as listed and agreed with the findings in the report. No results found.  ASSESSMENT & PLAN:   Carcinoma of upper-inner quadrant of left breast in female, estrogen receptor positive ([place]) #Stage I breast cancer-ER negative PR positive HER-2/neu negative breast cancer currently on letrozole.  Tolerating well.  No evidence of recurrence.  # mild anemia chronic hemoglobin 11.5.  Stable.  # The BMD measured at AP Spine L1-L4 is 1.260 g/cm2 with a T-score of 0.5/ normal.  Continue calcium vitamin D.  # DISPOSITION:  # follow up in 6 months [AM appt if possible]- MD; cbc/cmp-Dr.B    All questions were answered. The patient knows to call the clinic with any problems, questions or concerns.       Cammie Sickle, MD 04/09/2018 8:45 PM

## 2018-04-09 NOTE — Assessment & Plan Note (Addendum)
#  Stage I breast cancer-ER negative PR positive HER-2/neu negative breast cancer currently on letrozole.  Tolerating well.  No evidence of recurrence.  # mild anemia chronic hemoglobin 11.5.  Stable.  # The BMD measured at AP Spine L1-L4 is 1.260 g/cm2 with a T-score of 0.5/ normal.  Continue calcium vitamin D.  # DISPOSITION:  # follow up in 6 months [AM appt if possible]- MD; cbc/cmp-Dr.B

## 2018-05-03 ENCOUNTER — Other Ambulatory Visit: Payer: Self-pay | Admitting: Hematology and Oncology

## 2018-05-03 NOTE — Telephone Encounter (Signed)
  Dr Rogue Bussing is seeing Mrs Zipper.  Rx for Femara needs to be refilled by him.  M

## 2018-05-04 ENCOUNTER — Other Ambulatory Visit: Payer: Self-pay | Admitting: Hematology and Oncology

## 2018-05-05 NOTE — Telephone Encounter (Signed)
Refill request

## 2018-05-06 ENCOUNTER — Other Ambulatory Visit: Payer: Self-pay | Admitting: Internal Medicine

## 2018-05-06 MED ORDER — LETROZOLE 2.5 MG PO TABS: 2.5000 mg | ORAL_TABLET | Freq: Every day | ORAL | 2 refills | Status: DC

## 2018-05-15 ENCOUNTER — Encounter: Payer: Self-pay | Admitting: Radiation Oncology

## 2018-05-15 ENCOUNTER — Ambulatory Visit
Admission: RE | Admit: 2018-05-15 | Discharge: 2018-05-15 | Disposition: A | Discharge status: Home / Self Care | Payer: Medicare Other | Source: Ambulatory Visit | Attending: Radiation Oncology | Admitting: Radiation Oncology

## 2018-05-15 ENCOUNTER — Other Ambulatory Visit: Payer: Self-pay

## 2018-05-15 VITALS — BP 150/78 | HR 79 | Temp 98.0°F | Resp 18 | Wt 175.2 lb

## 2018-05-15 DIAGNOSIS — Z79811 Long term (current) use of aromatase inhibitors: Secondary | ICD-10-CM | POA: Insufficient documentation

## 2018-05-15 DIAGNOSIS — C50212 Malignant neoplasm of upper-inner quadrant of left female breast: Secondary | ICD-10-CM | POA: Insufficient documentation

## 2018-05-15 DIAGNOSIS — Z170 Estrogen receptor positive status [ER+]: Secondary | ICD-10-CM | POA: Insufficient documentation

## 2018-05-15 DIAGNOSIS — Z923 Personal history of irradiation: Secondary | ICD-10-CM | POA: Diagnosis not present

## 2018-05-15 NOTE — Progress Notes (Signed)
Radiation Oncology Follow up Note  Name: Joanne Collier   Date:   05/15/2018 MRN:  [id number] DOB: Jun 22, 1940    This 78 y.o. 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  Seen today in routine follow-up she is doing well.  She specifically denies breast tenderness cough or bone pain..  She is currently on Femara tolerating that well without side effects.  She had a mammogram back in December which I have reviewed was BI-RADS 2 benign.  COMPLICATIONS OF TREATMENT: none  FOLLOW UP COMPLIANCE: keeps appointments   PHYSICAL EXAM:  BP (!) 150/78   Pulse 79   Temp 98 F (36.7 C)   Resp 18   Wt 175 lb 2.5 oz (79.5 kg)   BMI 28.27 kg/m  Lungs are clear to A&P cardiac examination essentially unremarkable with regular rate and rhythm. No dominant mass or nodularity is noted in either breast in 2 positions examined. Incision is well-healed. No axillary or supraclavicular adenopathy is appreciated. Cosmetic result is excellent.  Well-developed well-nourished patient in NAD. HEENT reveals PERLA, EOMI, discs not visualized.  Oral cavity is clear. No oral mucosal lesions are identified. Neck is clear without evidence of cervical or supraclavicular adenopathy. Lungs are clear to A&P. Cardiac examination is essentially unremarkable with regular rate and rhythm without murmur rub or thrill. Abdomen is benign with no organomegaly or masses noted. Motor sensory and DTR levels are equal and symmetric in the upper and lower extremities. Cranial nerves II through XII are grossly intact. Proprioception is intact. No peripheral adenopathy or edema is identified. No  motor or sensory levels are noted. Crude visual fields are within normal range.  RADIOLOGY RESULTS: Mammogram reviewed and compatible with above-stated findings  PLAN: Present time patient is doing well with no evidence of disease 1 year out from whole breast radiation I am pleased with her overall progress.  I have asked to see her back in 1 year for follow-up.  She continues on Femara without side effect.  Patient continues close follow-up care with medical oncology.  Patient knows to call with any concerns at any time.  I would like to take this opportunity to thank you for allowing me to participate in the care of your patient.Noreene Filbert, MD

## 2018-08-13 ENCOUNTER — Encounter (INDEPENDENT_AMBULATORY_CARE_PROVIDER_SITE_OTHER): Payer: Medicare Other | Admitting: Ophthalmology

## 2018-10-04 ENCOUNTER — Other Ambulatory Visit: Payer: Self-pay

## 2018-10-04 ENCOUNTER — Encounter: Payer: Self-pay | Admitting: Internal Medicine

## 2018-10-04 DIAGNOSIS — C50212 Malignant neoplasm of upper-inner quadrant of left female breast: Secondary | ICD-10-CM

## 2018-10-04 NOTE — Progress Notes (Signed)
Patient denies any concerns today.  

## 2018-10-08 ENCOUNTER — Inpatient Hospital Stay (HOSPITAL_BASED_OUTPATIENT_CLINIC_OR_DEPARTMENT_OTHER): Payer: Medicare Other | Admitting: Internal Medicine

## 2018-10-08 ENCOUNTER — Other Ambulatory Visit: Payer: Self-pay

## 2018-10-08 ENCOUNTER — Inpatient Hospital Stay: Payer: Medicare Other | Attending: Internal Medicine

## 2018-10-08 DIAGNOSIS — C50212 Malignant neoplasm of upper-inner quadrant of left female breast: Secondary | ICD-10-CM

## 2018-10-08 DIAGNOSIS — Z79811 Long term (current) use of aromatase inhibitors: Secondary | ICD-10-CM | POA: Insufficient documentation

## 2018-10-08 DIAGNOSIS — H409 Unspecified glaucoma: Secondary | ICD-10-CM | POA: Insufficient documentation

## 2018-10-08 DIAGNOSIS — D649 Anemia, unspecified: Secondary | ICD-10-CM | POA: Diagnosis not present

## 2018-10-08 DIAGNOSIS — Z79899 Other long term (current) drug therapy: Secondary | ICD-10-CM | POA: Diagnosis not present

## 2018-10-08 DIAGNOSIS — Z8041 Family history of malignant neoplasm of ovary: Secondary | ICD-10-CM | POA: Insufficient documentation

## 2018-10-08 DIAGNOSIS — Z170 Estrogen receptor positive status [ER+]: Secondary | ICD-10-CM | POA: Diagnosis not present

## 2018-10-08 DIAGNOSIS — I10 Essential (primary) hypertension: Secondary | ICD-10-CM | POA: Diagnosis not present

## 2018-10-08 DIAGNOSIS — Z791 Long term (current) use of non-steroidal anti-inflammatories (NSAID): Secondary | ICD-10-CM | POA: Diagnosis not present

## 2018-10-08 DIAGNOSIS — Z7982 Long term (current) use of aspirin: Secondary | ICD-10-CM | POA: Diagnosis not present

## 2018-10-08 DIAGNOSIS — M1712 Unilateral primary osteoarthritis, left knee: Secondary | ICD-10-CM | POA: Insufficient documentation

## 2018-10-08 LAB — COMPREHENSIVE METABOLIC PANEL
ALT: 13 U/L (ref 0–44)
AST: 17 U/L (ref 15–41)
Albumin: 3.7 g/dL (ref 3.5–5.0)
Alkaline Phosphatase: 94 U/L (ref 38–126)
Anion gap: 8 (ref 5–15)
BUN: 17 mg/dL (ref 8–23)
CO2: 26 mmol/L (ref 22–32)
Calcium: 9.1 mg/dL (ref 8.9–10.3)
Chloride: 105 mmol/L (ref 98–111)
Creatinine, Ser: 0.81 mg/dL (ref 0.44–1.00)
GFR calc Af Amer: >60 mL/min (ref >60)
GFR calc non Af Amer: >60 mL/min (ref >60)
Glucose, Bld: 112 mg/dL — ABNORMAL HIGH (ref 70–99)
Potassium: 3.7 mmol/L (ref 3.5–5.1)
Sodium: 139 mmol/L (ref 135–145)
Total Bilirubin: 0.4 mg/dL (ref 0.3–1.2)
Total Protein: 8 g/dL (ref 6.5–8.1)

## 2018-10-08 LAB — CBC WITH DIFFERENTIAL/PLATELET
Abs Immature Granulocytes: 0.01 10*3/uL (ref 0.00–0.07)
Basophils Absolute: 0 10*3/uL (ref 0.0–0.1)
Basophils Relative: 0 %
Eosinophils Absolute: 0.1 10*3/uL (ref 0.0–0.5)
Eosinophils Relative: 1 %
HCT: 33.9 % — ABNORMAL LOW (ref 36.0–46.0)
Hemoglobin: 11 g/dL — ABNORMAL LOW (ref 12.0–15.0)
Immature Granulocytes: 0 %
Lymphocytes Relative: 20 %
Lymphs Abs: 1.4 10*3/uL (ref 0.7–4.0)
MCH: 29.5 pg (ref 26.0–34.0)
MCHC: 32.4 g/dL (ref 30.0–36.0)
MCV: 90.9 fL (ref 80.0–100.0)
Monocytes Absolute: 0.4 10*3/uL (ref 0.1–1.0)
Monocytes Relative: 6 %
Neutro Abs: 4.9 10*3/uL (ref 1.7–7.7)
Neutrophils Relative %: 73 %
Platelets: 215 10*3/uL (ref 150–400)
RBC: 3.73 MIL/uL — ABNORMAL LOW (ref 3.87–5.11)
RDW: 13 % (ref 11.5–15.5)
WBC: 6.7 10*3/uL (ref 4.0–10.5)
nRBC: 0 % (ref 0.0–0.2)

## 2018-10-08 NOTE — Progress Notes (Signed)
Pt in for follow up, denies any concerns today. 

## 2018-10-08 NOTE — Progress Notes (Signed)
[facility] CONSULT NOTE  Patient Care Team: Derinda Late, MD as PCP - General (Family Medicine) Schermerhorn, Gwen Her, MD as Referring Physician (Obstetrics and Gynecology) Leonie Green, MD as Referring Physician (Surgery)  CHIEF COMPLAINTS/PURPOSE OF CONSULTATION:  Breast cancer  #  Oncology History Overview Note  #Jan 2019- Left breast-invasive mammary carcinoma-ER-NEG; PR positive HER-2/neu negative.  Stage I status post lumpectomy [Dr. Burnett/Dr.Corcoran] followed by radiation; currently on aromatase inhibitor  #March 2019 bone density normal limits  DIAGNOSIS: BREAST CANCER  STAGE:  I   ;GOALS: cure  CURRENT/MOST RECENT THERAPY : letrozole.     Carcinoma of upper-inner quadrant of left breast in female, estrogen receptor positive ([place])  02/01/2017 Initial Diagnosis   Carcinoma of upper-inner quadrant of left breast in female, estrogen receptor positive ([place])     HISTORY OF PRESENTING ILLNESS:  Joanne Collier 78 y.o.  female stage I ER PR positive breast cancer currently on aromatase inhibitor is here for follow-up.  Patient denies any headaches.  Denies any nausea vomiting.  No shortness of breath.  Denies any hot flashes.  No bone pain.  Chronic mild joint pains.  Denies any blood in stools or black or stools. Denies any abdominal pain constipation or diarrhea.  Review of Systems  Constitutional: Negative for chills, diaphoresis, fever, malaise/fatigue and weight loss.  HENT: Negative for nosebleeds and sore throat.   Eyes: Negative for double vision.  Respiratory: Negative for cough, hemoptysis, sputum production, shortness of breath and wheezing.   Cardiovascular: Negative for chest pain, palpitations, orthopnea and leg swelling.  Gastrointestinal: Negative for abdominal pain, blood in stool, constipation, diarrhea, heartburn, melena, nausea and vomiting.  Genitourinary: Negative for dysuria, frequency and urgency.  Musculoskeletal: Positive  for back pain and joint pain.  Skin: Negative.  Negative for itching and rash.  Neurological: Negative for dizziness, tingling, focal weakness, weakness and headaches.  Endo/Heme/Allergies: Does not bruise/bleed easily.  Psychiatric/Behavioral: Negative for depression. The patient is not nervous/anxious and does not have insomnia.      MEDICAL HISTORY:  Past Medical History:  Diagnosis Date  . Arthritis    LEFT KNEE  . Cancer ([place])   . Glaucoma   . Hypertension     SURGICAL HISTORY: Past Surgical History:  Procedure Laterality Date  . BREAST BIOPSY Left 02/01/2017   INVASIVE MAMMARY CARCINOMA bx  . BREAST EXCISIONAL BIOPSY Left 02/23/2017   lumpectomy nl and sn  . ENDOMETRIAL BIOPSY    . EYE SURGERY Bilateral   . PARTIAL MASTECTOMY WITH NEEDLE LOCALIZATION Left 02/23/2017   Procedure: PARTIAL MASTECTOMY WITH NEEDLE LOCALIZATION;  Surgeon: Leonie Green, MD;  Location: [facility] ORS;  Service: General;  Laterality: Left;  . SENTINEL NODE BIOPSY Left 02/23/2017   Procedure: SENTINEL NODE BIOPSY;  Surgeon: Leonie Green, MD;  Location: [facility] ORS;  Service: General;  Laterality: Left;    SOCIAL HISTORY: Social History   Socioeconomic History  . Marital status: Married    Spouse name: Not on file  . Number of children: Not on file  . Years of education: Not on file  . Highest education level: Not on file  Occupational History  . Not on file  Social Needs  . Financial resource strain: Not on file  . Food insecurity    Worry: Not on file    Inability: Not on file  . Transportation needs    Medical: Not on file    Non-medical: Not on file  Tobacco Use  .  Smoking status: Never Smoker  . Smokeless tobacco: Never Used  Substance and Sexual Activity  . Alcohol use: No    Frequency: Never  . Drug use: No  . Sexual activity: Not on file  Lifestyle  . Physical activity    Days per week: Not on file    Minutes per session: Not on file  . Stress: Not on file   Relationships  . Social Herbalist on phone: Not on file    Gets together: Not on file    Attends religious service: Not on file    Active member of club or organization: Not on file    Attends meetings of clubs or organizations: Not on file    Relationship status: Not on file  . Intimate partner violence    Fear of current or ex partner: Not on file    Emotionally abused: Not on file    Physically abused: Not on file    Forced sexual activity: Not on file  Other Topics Concern  . Not on file  Social History Narrative  . Not on file    FAMILY HISTORY: Family History  Problem Relation Age of Onset  . Cancer Mother 41       ovarian  . Cancer Brother 90       colon  . Breast cancer Neg Hx     ALLERGIES:  has No Known Allergies.  MEDICATIONS:  Current Outpatient Medications  Medication Sig Dispense Refill  . aspirin EC 81 MG tablet Take 81 mg by mouth daily.    Marland Kitchen atenolol (TENORMIN) 25 MG tablet Take 25 mg by mouth every morning.     . cromolyn (OPTICROM) 4 % ophthalmic solution     . dorzolamide (TRUSOPT) 2 % ophthalmic solution Place 1 drop into both eyes 2 (two) times daily.    Marland Kitchen ibuprofen (ADVIL,MOTRIN) 200 MG tablet Take 200 mg by mouth every 6 (six) hours as needed for headache or moderate pain.    Marland Kitchen letrozole (FEMARA) 2.5 MG tablet Take 1 tablet (2.5 mg total) by mouth daily. 90 tablet 2  . TRAVATAN Z 0.004 % SOLN ophthalmic solution Place 1 drop into the right eye at bedtime.  1   No current facility-administered medications for this visit.    PHYSICAL EXAMINATION: ECOG PERFORMANCE STATUS: 0 - Asymptomatic  Vitals:   10/08/18 1055  BP: (!) 148/65  Pulse: (!) 56  Resp: 18  Temp: (!) 97.4 F (36.3 C)   Filed Weights   10/08/18 1055  Weight: 174 lb (78.9 kg)    Physical Exam  Constitutional: She is oriented to person, place, and time and well-developed, well-nourished, and in no distress.  HENT:  Head: Normocephalic and atraumatic.   Mouth/Throat: Oropharynx is clear and moist. No oropharyngeal exudate.  Eyes: Pupils are equal, round, and reactive to light.  Neck: Normal range of motion. Neck supple.  Cardiovascular: Normal rate and regular rhythm.  Pulmonary/Chest: Effort normal and breath sounds normal. No respiratory distress. She has no wheezes.  Abdominal: Soft. Bowel sounds are normal. She exhibits no distension and no mass. There is no abdominal tenderness. There is no rebound and no guarding.  Musculoskeletal: Normal range of motion.        General: No tenderness or edema.  Neurological: She is alert and oriented to person, place, and time.  Skin: Skin is warm.  Right and left BREAST exam (in the presence of nurse)- no unusual skin changes or dominant masses  felt. Surgical scars noted.    Psychiatric: Affect normal.   LABORATORY DATA:  I have reviewed the data as listed Lab Results  Component Value Date   WBC 6.7 10/08/2018   HGB 11.0 (L) 10/08/2018   HCT 33.9 (L) 10/08/2018   MCV 90.9 10/08/2018   PLT 215 10/08/2018   Recent Labs    04/09/18 1312 10/08/18 1008  NA 140 139  K 4.1 3.7  CL 107 105  CO2 27 26  GLUCOSE 94 112*  BUN 18 17  CREATININE 0.80 0.81  CALCIUM 9.0 9.1  GFRNONAA >60 >60  GFRAA >60 >60  PROT 8.4* 8.0  ALBUMIN 3.8 3.7  AST 16 17  ALT 12 13  ALKPHOS 94 94  BILITOT 0.5 0.4    RADIOGRAPHIC STUDIES: I have personally reviewed the radiological images as listed and agreed with the findings in the report. No results found.  ASSESSMENT & PLAN:   Carcinoma of upper-inner quadrant of left breast in female, estrogen receptor positive ([place]) #Stage I breast cancer-ER negative PR positive HER-2/neu negative breast cancer currently on letrozole.  Stable.  No evidence recurrence.  December 2019 mammogram normal.  #Chronic mild anemia chronic hemoglobin 11.0. colonoscopy- 2008; cologuard [Dr.schermerhorn; 2019]-NEG. check iron studies today.  #2019 BMD normal continue calcium  vitamin D. STABLE.   # DISPOSITION: add iron studies/ferritin today.  # follow up in 6 months [AM appt if possible]- MD; cbc/cmp-Dr.B  All questions were answered. The patient knows to call the clinic with any problems, questions or concerns.    Cammie Sickle, MD 10/08/2018 11:22 AM

## 2018-10-08 NOTE — Assessment & Plan Note (Addendum)
#  Stage I breast cancer-ER negative PR positive HER-2/neu negative breast cancer currently on letrozole.  Stable.  No evidence recurrence.  December 2019 mammogram normal.  #Chronic mild anemia chronic hemoglobin 11.0. colonoscopy- 2008; cologuard [Dr.schermerhorn; 2019]-NEG. check iron studies today.  #2019 BMD normal continue calcium vitamin D. STABLE.   # DISPOSITION: add iron studies/ferritin today.  # follow up in 6 months [AM appt if possible]- MD; cbc/cmp-Dr.B

## 2018-12-09 ENCOUNTER — Other Ambulatory Visit: Payer: Self-pay | Admitting: General Surgery

## 2018-12-09 DIAGNOSIS — Z853 Personal history of malignant neoplasm of breast: Secondary | ICD-10-CM

## 2018-12-18 ENCOUNTER — Encounter (INDEPENDENT_AMBULATORY_CARE_PROVIDER_SITE_OTHER): Payer: Medicare Other | Admitting: Ophthalmology

## 2019-01-17 ENCOUNTER — Ambulatory Visit
Admission: RE | Admit: 2019-01-17 | Discharge: 2019-01-17 | Disposition: A | Discharge status: Home / Self Care | Payer: Medicare Other | Source: Ambulatory Visit | Attending: General Surgery | Admitting: General Surgery

## 2019-01-17 DIAGNOSIS — Z853 Personal history of malignant neoplasm of breast: Secondary | ICD-10-CM | POA: Diagnosis present

## 2019-01-17 HISTORY — DX: Personal history of irradiation: Z92.3

## 2019-01-22 ENCOUNTER — Other Ambulatory Visit: Payer: Self-pay | Admitting: *Deleted

## 2019-01-22 MED ORDER — LETROZOLE 2.5 MG PO TABS: 2.5000 mg | ORAL_TABLET | Freq: Every day | ORAL | 0 refills | Status: DC

## 2019-04-08 ENCOUNTER — Other Ambulatory Visit: Payer: Self-pay | Admitting: *Deleted

## 2019-04-08 DIAGNOSIS — C50212 Malignant neoplasm of upper-inner quadrant of left female breast: Secondary | ICD-10-CM

## 2019-04-09 ENCOUNTER — Inpatient Hospital Stay: Payer: Medicare Other | Attending: Internal Medicine

## 2019-04-09 ENCOUNTER — Inpatient Hospital Stay (HOSPITAL_BASED_OUTPATIENT_CLINIC_OR_DEPARTMENT_OTHER): Payer: Medicare Other | Admitting: Internal Medicine

## 2019-04-09 ENCOUNTER — Encounter: Payer: Self-pay | Admitting: Internal Medicine

## 2019-04-09 DIAGNOSIS — Z791 Long term (current) use of non-steroidal anti-inflammatories (NSAID): Secondary | ICD-10-CM | POA: Diagnosis not present

## 2019-04-09 DIAGNOSIS — C50212 Malignant neoplasm of upper-inner quadrant of left female breast: Secondary | ICD-10-CM | POA: Insufficient documentation

## 2019-04-09 DIAGNOSIS — D649 Anemia, unspecified: Secondary | ICD-10-CM | POA: Insufficient documentation

## 2019-04-09 DIAGNOSIS — Z7982 Long term (current) use of aspirin: Secondary | ICD-10-CM | POA: Diagnosis not present

## 2019-04-09 DIAGNOSIS — Z170 Estrogen receptor positive status [ER+]: Secondary | ICD-10-CM

## 2019-04-09 DIAGNOSIS — Z923 Personal history of irradiation: Secondary | ICD-10-CM | POA: Diagnosis not present

## 2019-04-09 DIAGNOSIS — Z79899 Other long term (current) drug therapy: Secondary | ICD-10-CM | POA: Diagnosis not present

## 2019-04-09 DIAGNOSIS — Z79811 Long term (current) use of aromatase inhibitors: Secondary | ICD-10-CM | POA: Diagnosis not present

## 2019-04-09 DIAGNOSIS — I10 Essential (primary) hypertension: Secondary | ICD-10-CM | POA: Diagnosis not present

## 2019-04-09 LAB — CBC WITH DIFFERENTIAL/PLATELET
Abs Immature Granulocytes: 0.02 10*3/uL (ref 0.00–0.07)
Basophils Absolute: 0 10*3/uL (ref 0.0–0.1)
Basophils Relative: 1 %
Eosinophils Absolute: 0.1 10*3/uL (ref 0.0–0.5)
Eosinophils Relative: 1 %
HCT: 36.5 % (ref 36.0–46.0)
Hemoglobin: 11.6 g/dL — ABNORMAL LOW (ref 12.0–15.0)
Immature Granulocytes: 0 %
Lymphocytes Relative: 26 %
Lymphs Abs: 1.6 10*3/uL (ref 0.7–4.0)
MCH: 29.4 pg (ref 26.0–34.0)
MCHC: 31.8 g/dL (ref 30.0–36.0)
MCV: 92.6 fL (ref 80.0–100.0)
Monocytes Absolute: 0.3 10*3/uL (ref 0.1–1.0)
Monocytes Relative: 5 %
Neutro Abs: 4 10*3/uL (ref 1.7–7.7)
Neutrophils Relative %: 67 %
Platelets: 208 10*3/uL (ref 150–400)
RBC: 3.94 MIL/uL (ref 3.87–5.11)
RDW: 12.9 % (ref 11.5–15.5)
WBC: 6 10*3/uL (ref 4.0–10.5)
nRBC: 0 % (ref 0.0–0.2)

## 2019-04-09 LAB — COMPREHENSIVE METABOLIC PANEL
ALT: 17 U/L (ref 0–44)
AST: 20 U/L (ref 15–41)
Albumin: 3.7 g/dL (ref 3.5–5.0)
Alkaline Phosphatase: 86 U/L (ref 38–126)
Anion gap: 9 (ref 5–15)
BUN: 16 mg/dL (ref 8–23)
CO2: 27 mmol/L (ref 22–32)
Calcium: 9.2 mg/dL (ref 8.9–10.3)
Chloride: 103 mmol/L (ref 98–111)
Creatinine, Ser: 0.8 mg/dL (ref 0.44–1.00)
GFR calc Af Amer: >60 mL/min (ref >60)
GFR calc non Af Amer: >60 mL/min (ref >60)
Glucose, Bld: 126 mg/dL — ABNORMAL HIGH (ref 70–99)
Potassium: 3.9 mmol/L (ref 3.5–5.1)
Sodium: 139 mmol/L (ref 135–145)
Total Bilirubin: 0.4 mg/dL (ref 0.3–1.2)
Total Protein: 8.3 g/dL — ABNORMAL HIGH (ref 6.5–8.1)

## 2019-04-09 NOTE — Assessment & Plan Note (Addendum)
#  Stage I breast cancer-ER negative PR positive HER-2/neu negative breast cancer currently on letrozole.  STABLE.   No evidence recurrence.  December 2020-mammogram normal.  #Chronic mild anemia chronic hemoglobin 11.6. colonoscopy- 2008; cologuard [Dr.schermerhorn; 2019]-NEG. STABLE.  #2019 BMD normal continue calcium vitamin D. STABLE.    # DISPOSITION: # follow up in 6 months [AM appt if possible]- MD; cbc/cmp-Dr.B

## 2019-04-09 NOTE — Progress Notes (Signed)
[facility] CONSULT NOTE  Patient Care Team: Derinda Late, MD as PCP - General (Family Medicine) Schermerhorn, Gwen Her, MD as Referring Physician (Obstetrics and Gynecology) Leonie Green, MD as Referring Physician (Surgery)  CHIEF COMPLAINTS/PURPOSE OF CONSULTATION:  Breast cancer  #  Oncology History Overview Note  #Jan 2019- Left breast-invasive mammary carcinoma-ER-NEG; PR positive HER-2/neu negative.  Stage I status post lumpectomy [Dr. Burnett/Dr.Corcoran] followed by radiation; currently on aromatase inhibitor  #March 2019 bone density normal limits  DIAGNOSIS: BREAST CANCER  STAGE:  I   ;GOALS: cure  CURRENT/MOST RECENT THERAPY : letrozole.     Carcinoma of upper-inner quadrant of left breast in female, estrogen receptor positive ([place])  02/01/2017 Initial Diagnosis   Carcinoma of upper-inner quadrant of left breast in female, estrogen receptor positive ([place])     HISTORY OF PRESENTING ILLNESS:  Joanne Collier 79 y.o.  female stage I ER PR positive breast cancer currently on aromatase inhibitor is here for follow-up.  Patient is doing well.  Denies any headaches but denies any bone pain.  Denies any worsening joint pains.  No blood in stools or black or stools.   Review of Systems  Constitutional: Negative for chills, diaphoresis, fever, malaise/fatigue and weight loss.  HENT: Negative for nosebleeds and sore throat.   Eyes: Negative for double vision.  Respiratory: Negative for cough, hemoptysis, sputum production, shortness of breath and wheezing.   Cardiovascular: Negative for chest pain, palpitations, orthopnea and leg swelling.  Gastrointestinal: Negative for abdominal pain, blood in stool, constipation, diarrhea, heartburn, melena, nausea and vomiting.  Genitourinary: Negative for dysuria, frequency and urgency.  Musculoskeletal: Positive for back pain and joint pain.  Skin: Negative.  Negative for itching and rash.  Neurological: Negative  for dizziness, tingling, focal weakness, weakness and headaches.  Endo/Heme/Allergies: Does not bruise/bleed easily.  Psychiatric/Behavioral: Negative for depression. The patient is not nervous/anxious and does not have insomnia.      MEDICAL HISTORY:  Past Medical History:  Diagnosis Date  . Arthritis    LEFT KNEE  . Breast cancer ([place]) 2019   left breast  . Cancer ([place])   . Glaucoma   . Hypertension   . Personal history of radiation therapy     SURGICAL HISTORY: Past Surgical History:  Procedure Laterality Date  . BREAST BIOPSY Left 02/01/2017   INVASIVE MAMMARY CARCINOMA bx  . BREAST LUMPECTOMY  02/23/2017  . ENDOMETRIAL BIOPSY    . EYE SURGERY Bilateral   . PARTIAL MASTECTOMY WITH NEEDLE LOCALIZATION Left 02/23/2017   Procedure: PARTIAL MASTECTOMY WITH NEEDLE LOCALIZATION;  Surgeon: Leonie Green, MD;  Location: [facility] ORS;  Service: General;  Laterality: Left;  . SENTINEL NODE BIOPSY Left 02/23/2017   Procedure: SENTINEL NODE BIOPSY;  Surgeon: Leonie Green, MD;  Location: [facility] ORS;  Service: General;  Laterality: Left;    SOCIAL HISTORY: Social History   Socioeconomic History  . Marital status: Married    Spouse name: Not on file  . Number of children: Not on file  . Years of education: Not on file  . Highest education level: Not on file  Occupational History  . Not on file  Tobacco Use  . Smoking status: Never Smoker  . Smokeless tobacco: Never Used  Substance and Sexual Activity  . Alcohol use: No  . Drug use: No  . Sexual activity: Not on file  Other Topics Concern  . Not on file  Social History Narrative  . Not on file  Social Determinants of Health   Financial Resource Strain:   . Difficulty of Paying Living Expenses: Not on file  Food Insecurity:   . Worried About Charity fundraiser in the Last Year: Not on file  . Ran Out of Food in the Last Year: Not on file  Transportation Needs:   . Lack of Transportation (Medical): Not on  file  . Lack of Transportation (Non-Medical): Not on file  Physical Activity:   . Days of Exercise per Week: Not on file  . Minutes of Exercise per Session: Not on file  Stress:   . Feeling of Stress : Not on file  Social Connections:   . Frequency of Communication with Friends and Family: Not on file  . Frequency of Social Gatherings with Friends and Family: Not on file  . Attends Religious Services: Not on file  . Active Member of Clubs or Organizations: Not on file  . Attends Archivist Meetings: Not on file  . Marital Status: Not on file  Intimate Partner Violence:   . Fear of Current or Ex-Partner: Not on file  . Emotionally Abused: Not on file  . Physically Abused: Not on file  . Sexually Abused: Not on file    FAMILY HISTORY: Family History  Problem Relation Age of Onset  . Cancer Mother 82       ovarian  . Cancer Brother 71       colon  . Breast cancer Neg Hx     ALLERGIES:  has No Known Allergies.  MEDICATIONS:  Current Outpatient Medications  Medication Sig Dispense Refill  . aspirin EC 81 MG tablet Take 81 mg by mouth daily.    Marland Kitchen atenolol (TENORMIN) 25 MG tablet Take 25 mg by mouth every morning.     . cromolyn (OPTICROM) 4 % ophthalmic solution Place 1 drop into the right eye at bedtime.     . dorzolamide (TRUSOPT) 2 % ophthalmic solution Place 1 drop into both eyes 2 (two) times daily.    Marland Kitchen letrozole (FEMARA) 2.5 MG tablet Take 1 tablet (2.5 mg total) by mouth daily. 90 tablet 0  . TRAVATAN Z 0.004 % SOLN ophthalmic solution Place 1 drop into the right eye at bedtime.  1  . ibuprofen (ADVIL,MOTRIN) 200 MG tablet Take 200 mg by mouth every 6 (six) hours as needed for headache or moderate pain.     No current facility-administered medications for this visit.   PHYSICAL EXAMINATION: ECOG PERFORMANCE STATUS: 0 - Asymptomatic  Vitals:   04/09/19 1005  BP: 140/82  Pulse: 65  Temp: 97.6 F (36.4 C)   Filed Weights   04/09/19 1005  Weight: 177  lb 9.6 oz (80.6 kg)    Physical Exam  Constitutional: She is oriented to person, place, and time and well-developed, well-nourished, and in no distress.  HENT:  Head: Normocephalic and atraumatic.  Mouth/Throat: Oropharynx is clear and moist. No oropharyngeal exudate.  Eyes: Pupils are equal, round, and reactive to light.  Cardiovascular: Normal rate and regular rhythm.  Pulmonary/Chest: Effort normal and breath sounds normal. No respiratory distress. She has no wheezes.  Abdominal: Soft. Bowel sounds are normal. She exhibits no distension and no mass. There is no abdominal tenderness. There is no rebound and no guarding.  Musculoskeletal:        General: No tenderness or edema. Normal range of motion.     Cervical back: Normal range of motion and neck supple.  Neurological: She is alert and  oriented to person, place, and time.  Skin: Skin is warm.  Right and left BREAST exam (in the presence of nurse)- no unusual skin changes or dominant masses felt. Surgical scars noted.    Psychiatric: Affect normal.   LABORATORY DATA:  I have reviewed the data as listed Lab Results  Component Value Date   WBC 6.0 04/09/2019   HGB 11.6 (L) 04/09/2019   HCT 36.5 04/09/2019   MCV 92.6 04/09/2019   PLT 208 04/09/2019   Recent Labs    10/08/18 1008 04/09/19 0947  NA 139 139  K 3.7 3.9  CL 105 103  CO2 26 27  GLUCOSE 112* 126*  BUN 17 16  CREATININE 0.81 0.80  CALCIUM 9.1 9.2  GFRNONAA >60 >60  GFRAA >60 >60  PROT 8.0 8.3*  ALBUMIN 3.7 3.7  AST 17 20  ALT 13 17  ALKPHOS 94 86  BILITOT 0.4 0.4    RADIOGRAPHIC STUDIES: I have personally reviewed the radiological images as listed and agreed with the findings in the report. No results found.  ASSESSMENT & PLAN:   Carcinoma of upper-inner quadrant of left breast in female, estrogen receptor positive ([place]) #Stage I breast cancer-ER negative PR positive HER-2/neu negative breast cancer currently on letrozole.  STABLE.   No evidence  recurrence.  December 2020-mammogram normal.  #Chronic mild anemia chronic hemoglobin 11.6. colonoscopy- 2008; cologuard [Dr.schermerhorn; 2019]-NEG. STABLE.  #2019 BMD normal continue calcium vitamin D. STABLE.    # DISPOSITION: # follow up in 6 months [AM appt if possible]- MD; cbc/cmp-Dr.B  All questions were answered. The patient knows to call the clinic with any problems, questions or concerns.    Cammie Sickle, MD 04/09/2019 12:58 PM

## 2019-04-22 ENCOUNTER — Other Ambulatory Visit: Payer: Self-pay | Admitting: *Deleted

## 2019-04-22 MED ORDER — LETROZOLE 2.5 MG PO TABS: 2.5000 mg | ORAL_TABLET | Freq: Every day | ORAL | 0 refills | Status: DC

## 2019-05-07 ENCOUNTER — Encounter (INDEPENDENT_AMBULATORY_CARE_PROVIDER_SITE_OTHER): Payer: Medicare Other | Admitting: Ophthalmology

## 2019-05-07 ENCOUNTER — Other Ambulatory Visit: Payer: Self-pay

## 2019-05-07 DIAGNOSIS — I10 Essential (primary) hypertension: Secondary | ICD-10-CM

## 2019-05-07 DIAGNOSIS — H43813 Vitreous degeneration, bilateral: Secondary | ICD-10-CM

## 2019-05-07 DIAGNOSIS — H35033 Hypertensive retinopathy, bilateral: Secondary | ICD-10-CM | POA: Diagnosis not present

## 2019-05-07 DIAGNOSIS — H35373 Puckering of macula, bilateral: Secondary | ICD-10-CM

## 2019-05-23 ENCOUNTER — Ambulatory Visit
Admission: RE | Admit: 2019-05-23 | Discharge: 2019-05-23 | Disposition: A | Discharge status: Home / Self Care | Payer: Medicare Other | Source: Ambulatory Visit | Attending: Radiation Oncology | Admitting: Radiation Oncology

## 2019-05-23 ENCOUNTER — Other Ambulatory Visit: Payer: Self-pay

## 2019-05-23 ENCOUNTER — Encounter: Payer: Self-pay | Admitting: Radiation Oncology

## 2019-05-23 VITALS — BP 149/78 | HR 66 | Temp 97.3°F | Resp 18 | Wt 177.3 lb

## 2019-05-23 DIAGNOSIS — C50212 Malignant neoplasm of upper-inner quadrant of left female breast: Secondary | ICD-10-CM | POA: Diagnosis present

## 2019-05-23 DIAGNOSIS — Z923 Personal history of irradiation: Secondary | ICD-10-CM | POA: Insufficient documentation

## 2019-05-23 DIAGNOSIS — Z170 Estrogen receptor positive status [ER+]: Secondary | ICD-10-CM | POA: Diagnosis not present

## 2019-05-23 DIAGNOSIS — Z79811 Long term (current) use of aromatase inhibitors: Secondary | ICD-10-CM | POA: Diagnosis not present

## 2019-05-23 NOTE — Progress Notes (Signed)
Radiation Oncology Follow up Note  Name: Joanne Collier   Date:   05/23/2019 MRN:  [id number] DOB: 05-04-1940    This 79 y.o. 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  She specifically denies breast tenderness cough or bone pain..  She had mammograms back in December which I have reviewed were BI-RADS 2 benign.  She is currently on letrozole tolerating that well without side effect.  COMPLICATIONS OF TREATMENT: none  FOLLOW UP COMPLIANCE: keeps appointments   PHYSICAL EXAM:  BP (!) 149/78 (BP Location: Left Arm)   Pulse 66   Temp (!) 97.3 F (36.3 C) (Tympanic)   Resp 18   Wt 177 lb 4.8 oz (80.4 kg)   BMI 28.62 kg/m  Lungs are clear to A&P cardiac examination essentially unremarkable with regular rate and rhythm. No dominant mass or nodularity is noted in either breast in 2 positions examined. Incision is well-healed. No axillary or supraclavicular adenopathy is appreciated. Cosmetic result is excellent.  Well-developed well-nourished patient in NAD. HEENT reveals PERLA, EOMI, discs not visualized.  Oral cavity is clear. No oral mucosal lesions are identified. Neck is clear without evidence of cervical or supraclavicular adenopathy. Lungs are clear to A&P. Cardiac examination is essentially unremarkable with regular rate and rhythm without murmur rub or thrill. Abdomen is benign with no organomegaly or masses noted. Motor sensory and DTR levels are equal and symmetric in the upper and lower extremities. Cranial nerves II through XII are grossly intact. Proprioception is intact. No peripheral adenopathy or edema is  identified. No motor or sensory levels are noted. Crude visual fields are within normal range.  RADIOLOGY RESULTS: Mammograms reviewed compatible with above-stated findings  PLAN: Present time she is doing well 2 years out with no evidence of disease.  I am pleased with her overall progress.  Of asked to see her back in 1 year for follow-up.  She continues on letrozole without side effect.  She is already been scheduled for follow-up mammograms.  Patient knows to call with any concerns.  I would like to take this opportunity to thank you for allowing me to participate in the care of your patient.Noreene Filbert, MD

## 2019-06-18 ENCOUNTER — Other Ambulatory Visit: Payer: Self-pay | Admitting: Obstetrics and Gynecology

## 2019-06-18 DIAGNOSIS — Z853 Personal history of malignant neoplasm of breast: Secondary | ICD-10-CM

## 2019-06-18 DIAGNOSIS — C50912 Malignant neoplasm of unspecified site of left female breast: Secondary | ICD-10-CM

## 2019-07-23 ENCOUNTER — Other Ambulatory Visit: Payer: Self-pay | Admitting: *Deleted

## 2019-07-23 MED ORDER — LETROZOLE 2.5 MG PO TABS: 2.5000 mg | ORAL_TABLET | Freq: Every day | ORAL | 0 refills | Status: DC

## 2019-08-10 IMAGING — MG MM DIGITAL DIAGNOSTIC UNILAT*L* W/ TOMO W/ CAD
8 of 12 series · 8 of 28 positions shown · non-contrast
Comparison: 01/02/2017 and earlier

CLINICAL DATA: Patient returns after screening study for evaluation
of a possible left breast mass.

EXAM:
2D DIGITAL DIAGNOSTIC LEFT MAMMOGRAM WITH CAD AND ADJUNCT TOMO
ULTRASOUND LEFT BREAST

[L MLO]
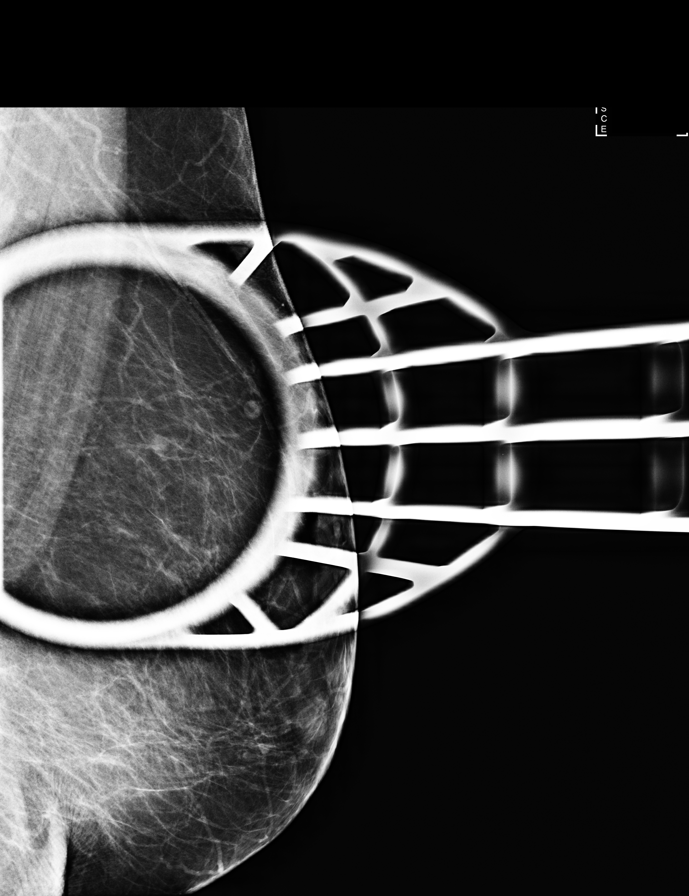

[L MLO synth-2D]
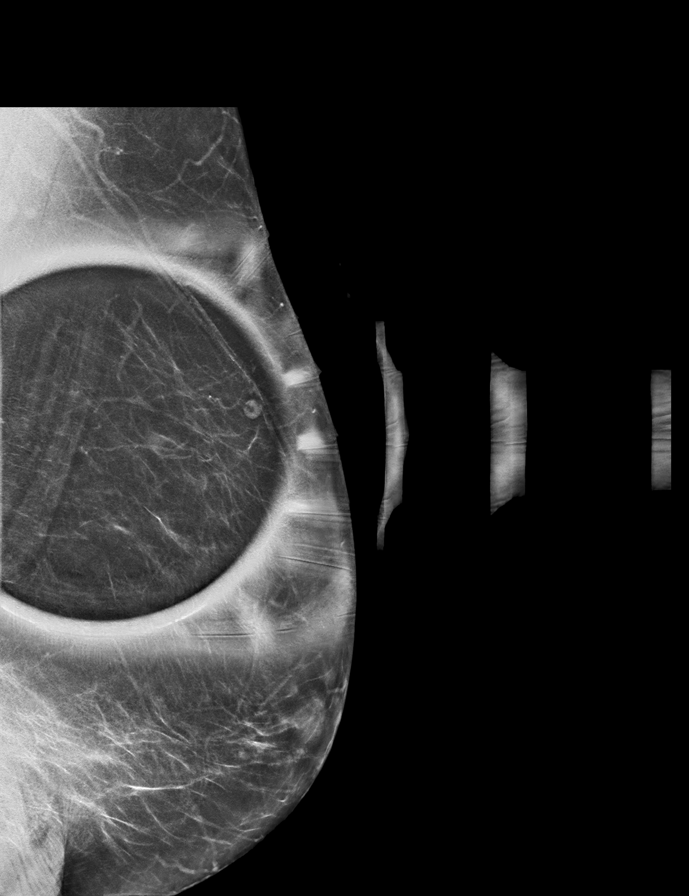

[L ML synth-2D]
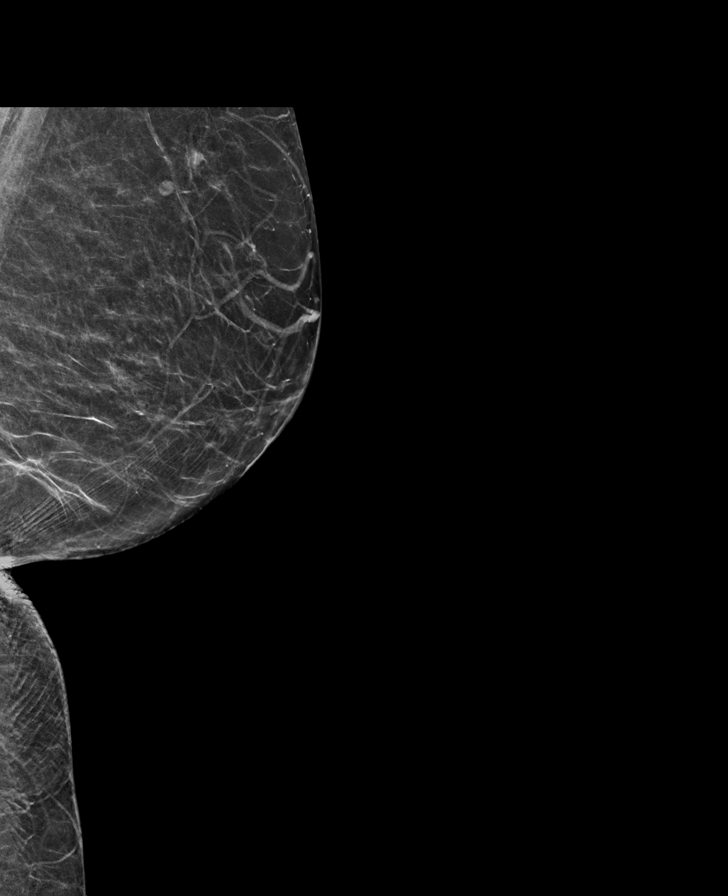

[L CC synth-2D (1 of 2)]
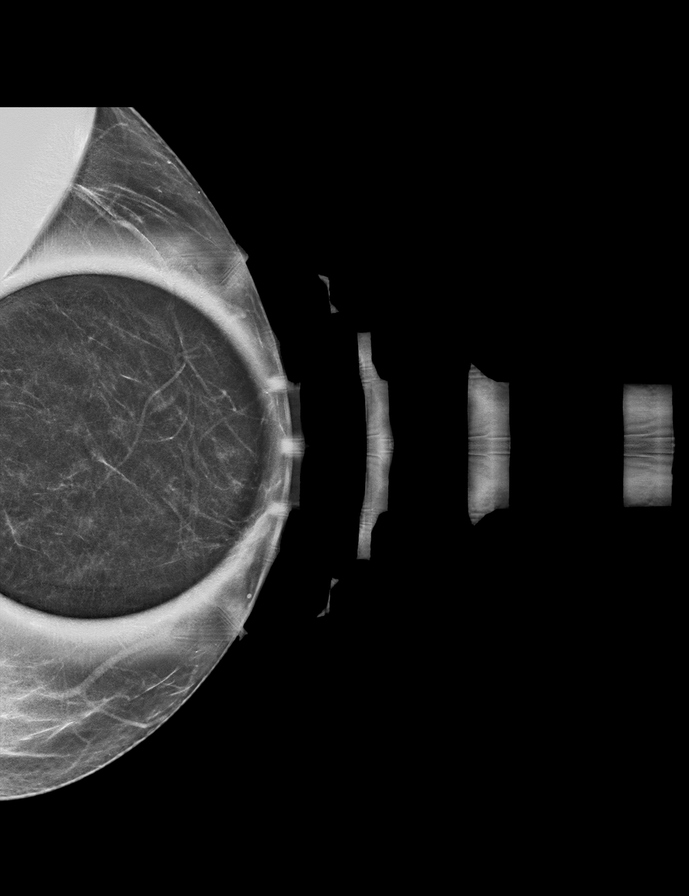

[L CC (1 of 2)]
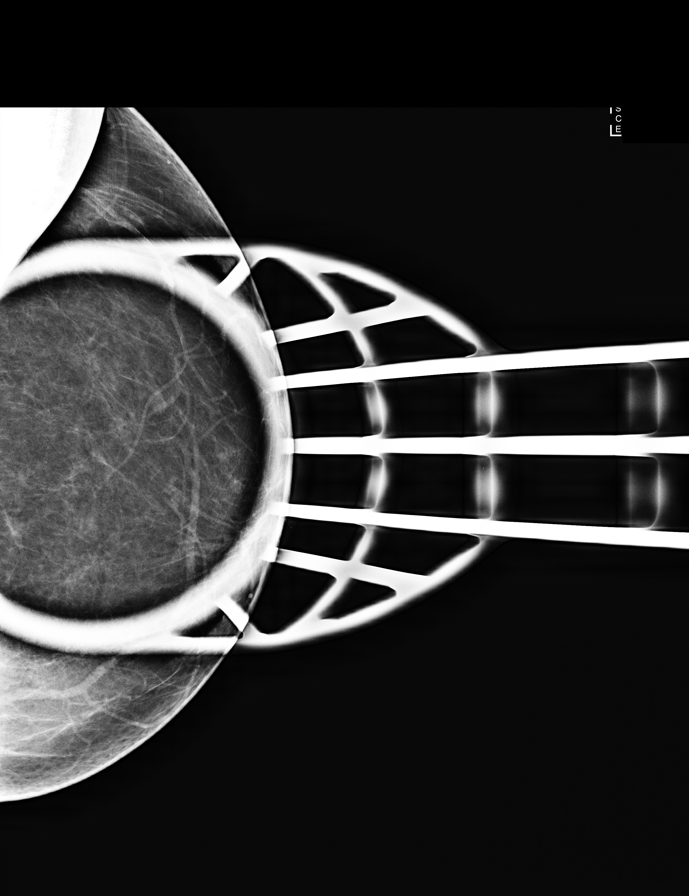

[L CC (2 of 2)]
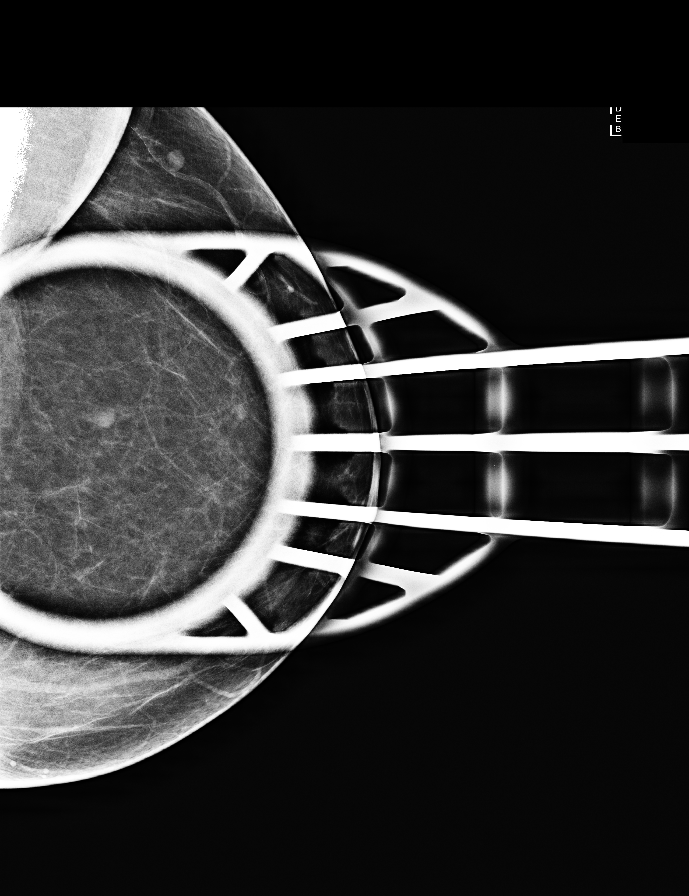

[L ML]
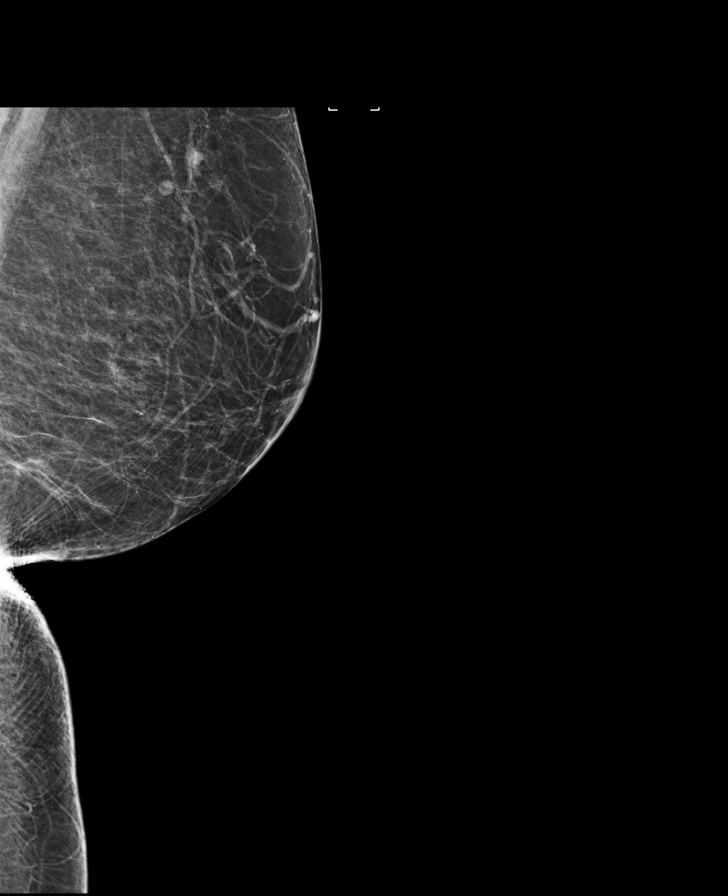

[L CC synth-2D (2 of 2)]
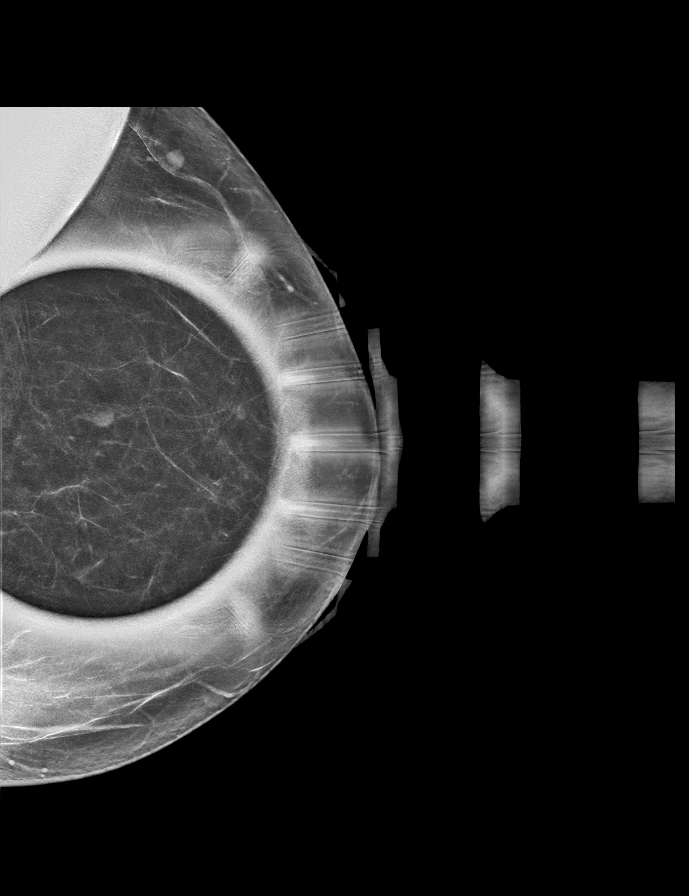

[8 of 28 positions shown; findings below may reference images not displayed]

ACR Breast Density Category b: There are scattered areas of
fibroglandular density.
FINDINGS: Additional 2-D and 3-D images are performed. These views confirm
presence of a small mass with indistinct margins in the upper
central portion of the left breast, further evaluated with
ultrasound.

Mammographic images were processed with CAD.

On physical exam, I palpate no abnormality in the upper central
portion of the left breast.

Targeted ultrasound is performed, showing a small solid nodule in
the 12 o'clock location of the left breast 6 cm from the nipple
which measures 0.6 x 0.4 x 0.4 cm. Some margins are angular. No
associated internal vascularity. There is neither posterior acoustic
enhancement or shadowing. Evaluation of the axilla is negative.
IMPRESSION: Indeterminate solid nodule in the 12 o'clock location of the left
breast for which biopsy is indicated.

RECOMMENDATION:
Ultrasound-guided core biopsy of left breast 12 o'clock lesion.

I have discussed the findings and recommendations with the patient.
Results were also provided in writing at the conclusion of the
visit. If applicable, a reminder letter will be sent to the patient
regarding the next appointment.

BI-RADS CATEGORY  4: Suspicious.

## 2019-09-15 ENCOUNTER — Other Ambulatory Visit: Payer: Self-pay | Admitting: Internal Medicine

## 2019-10-08 ENCOUNTER — Encounter: Payer: Self-pay | Admitting: Internal Medicine

## 2019-10-08 ENCOUNTER — Inpatient Hospital Stay: Payer: Medicare Other | Attending: Internal Medicine

## 2019-10-08 ENCOUNTER — Inpatient Hospital Stay (HOSPITAL_BASED_OUTPATIENT_CLINIC_OR_DEPARTMENT_OTHER): Payer: Medicare Other | Admitting: Internal Medicine

## 2019-10-08 ENCOUNTER — Other Ambulatory Visit: Payer: Self-pay

## 2019-10-08 VITALS — BP 152/67 | HR 60 | Temp 97.2°F | Resp 16 | Ht 66.0 in | Wt 172.0 lb

## 2019-10-08 DIAGNOSIS — I10 Essential (primary) hypertension: Secondary | ICD-10-CM | POA: Diagnosis not present

## 2019-10-08 DIAGNOSIS — Z13820 Encounter for screening for osteoporosis: Secondary | ICD-10-CM

## 2019-10-08 DIAGNOSIS — Z7982 Long term (current) use of aspirin: Secondary | ICD-10-CM | POA: Diagnosis not present

## 2019-10-08 DIAGNOSIS — Z791 Long term (current) use of non-steroidal anti-inflammatories (NSAID): Secondary | ICD-10-CM | POA: Diagnosis not present

## 2019-10-08 DIAGNOSIS — Z923 Personal history of irradiation: Secondary | ICD-10-CM | POA: Diagnosis not present

## 2019-10-08 DIAGNOSIS — Z170 Estrogen receptor positive status [ER+]: Secondary | ICD-10-CM | POA: Diagnosis not present

## 2019-10-08 DIAGNOSIS — Z79899 Other long term (current) drug therapy: Secondary | ICD-10-CM | POA: Insufficient documentation

## 2019-10-08 DIAGNOSIS — D509 Iron deficiency anemia, unspecified: Secondary | ICD-10-CM | POA: Diagnosis not present

## 2019-10-08 DIAGNOSIS — C50212 Malignant neoplasm of upper-inner quadrant of left female breast: Secondary | ICD-10-CM

## 2019-10-08 DIAGNOSIS — D649 Anemia, unspecified: Secondary | ICD-10-CM | POA: Insufficient documentation

## 2019-10-08 DIAGNOSIS — Z79811 Long term (current) use of aromatase inhibitors: Secondary | ICD-10-CM | POA: Insufficient documentation

## 2019-10-08 LAB — CBC WITH DIFFERENTIAL/PLATELET
Abs Immature Granulocytes: 0.02 10*3/uL (ref 0.00–0.07)
Basophils Absolute: 0 10*3/uL (ref 0.0–0.1)
Basophils Relative: 1 %
Eosinophils Absolute: 0.1 10*3/uL (ref 0.0–0.5)
Eosinophils Relative: 1 %
HCT: 32.6 % — ABNORMAL LOW (ref 36.0–46.0)
Hemoglobin: 11 g/dL — ABNORMAL LOW (ref 12.0–15.0)
Immature Granulocytes: 0 %
Lymphocytes Relative: 23 %
Lymphs Abs: 1.5 10*3/uL (ref 0.7–4.0)
MCH: 29.9 pg (ref 26.0–34.0)
MCHC: 33.7 g/dL (ref 30.0–36.0)
MCV: 88.6 fL (ref 80.0–100.0)
Monocytes Absolute: 0.4 10*3/uL (ref 0.1–1.0)
Monocytes Relative: 6 %
Neutro Abs: 4.4 10*3/uL (ref 1.7–7.7)
Neutrophils Relative %: 69 %
Platelets: 223 10*3/uL (ref 150–400)
RBC: 3.68 MIL/uL — ABNORMAL LOW (ref 3.87–5.11)
RDW: 13.1 % (ref 11.5–15.5)
WBC: 6.4 10*3/uL (ref 4.0–10.5)
nRBC: 0 % (ref 0.0–0.2)

## 2019-10-08 LAB — COMPREHENSIVE METABOLIC PANEL
ALT: 10 U/L (ref 0–44)
AST: 17 U/L (ref 15–41)
Albumin: 3.6 g/dL (ref 3.5–5.0)
Alkaline Phosphatase: 90 U/L (ref 38–126)
Anion gap: 10 (ref 5–15)
BUN: 13 mg/dL (ref 8–23)
CO2: 25 mmol/L (ref 22–32)
Calcium: 8.8 mg/dL — ABNORMAL LOW (ref 8.9–10.3)
Chloride: 103 mmol/L (ref 98–111)
Creatinine, Ser: 0.79 mg/dL (ref 0.44–1.00)
GFR calc Af Amer: >60 mL/min (ref >60)
GFR calc non Af Amer: >60 mL/min (ref >60)
Glucose, Bld: 122 mg/dL — ABNORMAL HIGH (ref 70–99)
Potassium: 3.6 mmol/L (ref 3.5–5.1)
Sodium: 138 mmol/L (ref 135–145)
Total Bilirubin: 0.4 mg/dL (ref 0.3–1.2)
Total Protein: 8.1 g/dL (ref 6.5–8.1)

## 2019-10-08 NOTE — Progress Notes (Signed)
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  Stage I status post lumpectomy [Dr. Burnett/Dr.Corcoran] followed by radiation; currently on aromatase inhibitor  #March 2019 bone density normal limits  DIAGNOSIS: BREAST CANCER  STAGE:  I   ;GOALS: cure  CURRENT/MOST RECENT THERAPY : letrozole.     Carcinoma of upper-inner quadrant of left breast in female, estrogen receptor positive ([place])  02/01/2017 Initial Diagnosis   Carcinoma of upper-inner quadrant of left breast in female, estrogen receptor positive ([place])     HISTORY OF PRESENTING ILLNESS:  Joanne Collier 79 y.o.  female stage I ER PR positive breast cancer currently on aromatase inhibitor is here for follow-up.  Patient denies any hot flashes.  Denies any worsening back pain joint pains.  Appetite is good.  No weight loss.  No blood in stools or black or stools.  Review of Systems  Constitutional: Negative for chills, diaphoresis, fever, malaise/fatigue and weight loss.  HENT: Negative for nosebleeds and sore throat.   Eyes: Negative for double vision.  Respiratory: Negative for cough, hemoptysis, sputum production, shortness of breath and wheezing.   Cardiovascular: Negative for chest pain, palpitations, orthopnea and leg swelling.  Gastrointestinal: Negative for abdominal pain, blood in stool, constipation, diarrhea, heartburn, melena, nausea and vomiting.  Genitourinary: Negative for dysuria, frequency and urgency.  Musculoskeletal: Positive for back pain and joint pain.   Skin: Negative.  Negative for itching and rash.  Neurological: Negative for dizziness, tingling, focal weakness, weakness and headaches.  Endo/Heme/Allergies: Does not bruise/bleed easily.  Psychiatric/Behavioral: Negative for depression. The patient is not nervous/anxious and does not have insomnia.      MEDICAL HISTORY:  Past Medical History:  Diagnosis Date  . Arthritis    LEFT KNEE  . Breast cancer ([place]) 2019   left breast  . Cancer ([place])   . Glaucoma   . Hypertension   . Personal history of radiation therapy     SURGICAL HISTORY: Past Surgical History:  Procedure Laterality Date  . BREAST BIOPSY Left 02/01/2017   INVASIVE MAMMARY CARCINOMA bx  . BREAST LUMPECTOMY  02/23/2017  . ENDOMETRIAL BIOPSY    . EYE SURGERY Bilateral   . PARTIAL MASTECTOMY WITH NEEDLE LOCALIZATION Left 02/23/2017   Procedure: PARTIAL MASTECTOMY WITH NEEDLE LOCALIZATION;  Surgeon: Leonie Green, MD;  Location: [facility] ORS;  Service: General;  Laterality: Left;  . SENTINEL NODE BIOPSY Left 02/23/2017   Procedure: SENTINEL NODE BIOPSY;  Surgeon: Leonie Green, MD;  Location: [facility] ORS;  Service: General;  Laterality: Left;    SOCIAL HISTORY: Social History   Socioeconomic History  . Marital status: Married    Spouse name: Not on file  . Number of children: Not on file  . Years of education: Not on file  . Highest education level: Not on file  Occupational History  . Not on file  Tobacco Use  . Smoking status: Never Smoker  . Smokeless tobacco: Never Used  Vaping Use  . Vaping Use: Never used  Substance and Sexual Activity  . Alcohol use: No  . Drug use: No  . Sexual activity: Not on file  Other  Topics Concern  . Not on file  Social History Narrative  . Not on file   Social Determinants of Health   Financial Resource Strain:   . Difficulty of Paying Living Expenses: Not on file  Food Insecurity:   . Worried About Charity fundraiser in the Last Year: Not on file  . Ran  Out of Food in the Last Year: Not on file  Transportation Needs:   . Lack of Transportation (Medical): Not on file  . Lack of Transportation (Non-Medical): Not on file  Physical Activity:   . Days of Exercise per Week: Not on file  . Minutes of Exercise per Session: Not on file  Stress:   . Feeling of Stress : Not on file  Social Connections:   . Frequency of Communication with Friends and Family: Not on file  . Frequency of Social Gatherings with Friends and Family: Not on file  . Attends Religious Services: Not on file  . Active Member of Clubs or Organizations: Not on file  . Attends Archivist Meetings: Not on file  . Marital Status: Not on file  Intimate Partner Violence:   . Fear of Current or Ex-Partner: Not on file  . Emotionally Abused: Not on file  . Physically Abused: Not on file  . Sexually Abused: Not on file    FAMILY HISTORY: Family History  Problem Relation Age of Onset  . Cancer Mother 26       ovarian  . Cancer Brother 79       colon  . Breast cancer Neg Hx     ALLERGIES:  has No Known Allergies.  MEDICATIONS:  Current Outpatient Medications  Medication Sig Dispense Refill  . aspirin EC 81 MG tablet Take 81 mg by mouth daily.    Marland Kitchen atenolol (TENORMIN) 25 MG tablet Take 25 mg by mouth every morning.     . dorzolamide-timolol (COSOPT) 22.3-6.8 MG/ML ophthalmic solution 1 drop 2 (two) times daily.    Marland Kitchen ibuprofen (ADVIL,MOTRIN) 200 MG tablet Take 200 mg by mouth every 6 (six) hours as needed for headache or moderate pain.    Marland Kitchen letrozole (FEMARA) 2.5 MG tablet TAKE 1 TABLET(2.5 MG) BY MOUTH DAILY 90 tablet 0  . TRAVATAN Z 0.004 % SOLN ophthalmic solution Place 1 drop into the right eye at bedtime.  1   No current facility-administered medications for this visit.   PHYSICAL EXAMINATION: ECOG PERFORMANCE STATUS: 0 - Asymptomatic  Vitals:   10/08/19 0933  BP: (!) 152/67  Pulse: 60  Resp: 16  Temp: (!) 97.2 F (36.2 C)  SpO2: 100%   Filed  Weights   10/08/19 0933  Weight: 172 lb (78 kg)    Physical Exam HENT:     Head: Normocephalic and atraumatic.     Mouth/Throat:     Pharynx: No oropharyngeal exudate.  Eyes:     Pupils: Pupils are equal, round, and reactive to light.  Cardiovascular:     Rate and Rhythm: Normal rate and regular rhythm.  Pulmonary:     Effort: Pulmonary effort is normal. No respiratory distress.     Breath sounds: Normal breath sounds. No wheezing.  Abdominal:     General: Bowel sounds are normal. There is no distension.     Palpations: Abdomen is soft. There is no mass.     Tenderness: There is no abdominal tenderness. There is no guarding or rebound.  Musculoskeletal:        General: No tenderness.  Normal range of motion.     Cervical back: Normal range of motion and neck supple.  Skin:    General: Skin is warm.     Comments: Right and left BREAST exam (in the presence of nurse)- no unusual skin changes or dominant masses felt. Surgical scars noted.    Neurological:     Mental Status: She is alert and oriented to person, place, and time.  Psychiatric:        Mood and Affect: Affect normal.    LABORATORY DATA:  I have reviewed the data as listed Lab Results  Component Value Date   WBC 6.4 10/08/2019   HGB 11.0 (L) 10/08/2019   HCT 32.6 (L) 10/08/2019   MCV 88.6 10/08/2019   PLT 223 10/08/2019   Recent Labs    04/09/19 0947 10/08/19 0903  NA 139 138  K 3.9 3.6  CL 103 103  CO2 27 25  GLUCOSE 126* 122*  BUN 16 13  CREATININE 0.80 0.79  CALCIUM 9.2 8.8*  GFRNONAA >60 >60  GFRAA >60 >60  PROT 8.3* 8.1  ALBUMIN 3.7 3.6  AST 20 17  ALT 17 10  ALKPHOS 86 90  BILITOT 0.4 0.4    RADIOGRAPHIC STUDIES: I have personally reviewed the radiological images as listed and agreed with the findings in the report. No results found.  ASSESSMENT & PLAN:   Carcinoma of upper-inner quadrant of left breast in female, estrogen receptor positive ([place]) #Stage I breast cancer-ER negative  PR positive HER-2/neu negative breast cancer currently on letrozole.  STABLE; No evidence recurrence.  December 2020-mammogram normal.  #Chronic mild anemia chronic hemoglobin 11.0. colonoscopy- 2008; cologuard [Dr.schermerhorn; 2019]-NEG. STABLE. Add iron studies/ferritin today.   #2019 BMD normal continue calcium vitamin D. STABLE. Will order BMD next visit  # COVID BOOSTER: Discussed given patient's diagnosis and other comorbidities/therapies-patient would be considered immunocompromised.  As per CDC recommendation/FDA approval-I would recommend booster vaccine.  Patient is  Interested.      # DISPOSITION:add iron studies ferrtin-  # follow up in 6 months [AM appt if possible]- MD; cbc/cmp/ BMD prior-Dr.B  All questions were answered. The patient knows to call the clinic with any problems, questions or concerns.    Cammie Sickle, MD 10/08/2019 12:29 PM

## 2019-10-08 NOTE — Assessment & Plan Note (Addendum)
#  Stage I breast cancer-ER negative PR positive HER-2/neu negative breast cancer currently on letrozole.  STABLE; No evidence recurrence.  December 2020-mammogram normal.  #Chronic mild anemia chronic hemoglobin 11.0. colonoscopy- 2008; cologuard [Dr.schermerhorn; 2019]-NEG. STABLE. Add iron studies/ferritin today.   #2019 BMD normal continue calcium vitamin D. STABLE. Will order BMD next visit  # COVID BOOSTER: Discussed given patient's diagnosis and other comorbidities/therapies-patient would be considered immunocompromised.  As per CDC recommendation/FDA approval-I would recommend booster vaccine.  Patient is  Interested.      # DISPOSITION:add iron studies ferrtin-  # follow up in 6 months [AM appt if possible]- MD; cbc/cmp/ BMD prior-Dr.B

## 2020-01-16 ENCOUNTER — Telehealth: Payer: Self-pay | Admitting: *Deleted

## 2020-01-16 MED ORDER — LETROZOLE 2.5 MG PO TABS: ORAL_TABLET | ORAL | 3 refills | Status: DC

## 2020-01-16 NOTE — Telephone Encounter (Signed)
Incoming fax from pharmacy - patient requires letrozole RF

## 2020-01-19 ENCOUNTER — Ambulatory Visit
Admission: RE | Admit: 2020-01-19 | Discharge: 2020-01-19 | Disposition: A | Discharge status: Home / Self Care | Payer: Medicare Other | Source: Ambulatory Visit | Attending: Obstetrics and Gynecology | Admitting: Obstetrics and Gynecology

## 2020-01-19 ENCOUNTER — Other Ambulatory Visit: Payer: Self-pay

## 2020-01-19 ENCOUNTER — Ambulatory Visit
Admission: RE | Admit: 2020-01-19 | Discharge: 2020-01-19 | Disposition: A | Discharge status: Home / Self Care | Payer: Medicare Other | Source: Ambulatory Visit | Attending: Internal Medicine | Admitting: Internal Medicine

## 2020-01-19 ENCOUNTER — Other Ambulatory Visit: Payer: Self-pay | Admitting: Obstetrics and Gynecology

## 2020-01-19 DIAGNOSIS — Z853 Personal history of malignant neoplasm of breast: Secondary | ICD-10-CM | POA: Insufficient documentation

## 2020-01-19 DIAGNOSIS — C50212 Malignant neoplasm of upper-inner quadrant of left female breast: Secondary | ICD-10-CM | POA: Insufficient documentation

## 2020-01-19 DIAGNOSIS — Z13820 Encounter for screening for osteoporosis: Secondary | ICD-10-CM | POA: Diagnosis present

## 2020-01-19 DIAGNOSIS — C50912 Malignant neoplasm of unspecified site of left female breast: Secondary | ICD-10-CM

## 2020-01-19 DIAGNOSIS — Z79811 Long term (current) use of aromatase inhibitors: Secondary | ICD-10-CM | POA: Insufficient documentation

## 2020-01-19 DIAGNOSIS — Z170 Estrogen receptor positive status [ER+]: Secondary | ICD-10-CM | POA: Insufficient documentation

## 2020-01-20 ENCOUNTER — Other Ambulatory Visit: Payer: Self-pay | Admitting: Obstetrics and Gynecology

## 2020-01-20 ENCOUNTER — Telehealth: Payer: Self-pay | Admitting: Internal Medicine

## 2020-01-20 DIAGNOSIS — N6320 Unspecified lump in the left breast, unspecified quadrant: Secondary | ICD-10-CM

## 2020-01-20 DIAGNOSIS — R928 Other abnormal and inconclusive findings on diagnostic imaging of breast: Secondary | ICD-10-CM

## 2020-01-20 NOTE — Telephone Encounter (Signed)
H/T-please inform patient that her bone density test is normal.  Continue calcium plus vitamin D  Follow-up as planned.  GB

## 2020-01-22 ENCOUNTER — Ambulatory Visit
Admission: RE | Admit: 2020-01-22 | Discharge: 2020-01-22 | Disposition: A | Discharge status: Home / Self Care | Payer: Medicare Other | Source: Ambulatory Visit | Attending: Obstetrics and Gynecology | Admitting: Obstetrics and Gynecology

## 2020-01-22 ENCOUNTER — Other Ambulatory Visit: Payer: Self-pay

## 2020-01-22 DIAGNOSIS — N6320 Unspecified lump in the left breast, unspecified quadrant: Secondary | ICD-10-CM | POA: Insufficient documentation

## 2020-01-22 DIAGNOSIS — R928 Other abnormal and inconclusive findings on diagnostic imaging of breast: Secondary | ICD-10-CM | POA: Diagnosis present

## 2020-01-22 HISTORY — PX: BREAST BIOPSY: SHX20

## 2020-01-27 LAB — SURGICAL PATHOLOGY

## 2020-04-07 ENCOUNTER — Inpatient Hospital Stay: Payer: Medicare Other | Attending: Internal Medicine

## 2020-04-07 ENCOUNTER — Inpatient Hospital Stay (HOSPITAL_BASED_OUTPATIENT_CLINIC_OR_DEPARTMENT_OTHER): Payer: Medicare Other | Admitting: Internal Medicine

## 2020-04-07 ENCOUNTER — Other Ambulatory Visit: Payer: Self-pay

## 2020-04-07 ENCOUNTER — Encounter: Payer: Self-pay | Admitting: Internal Medicine

## 2020-04-07 DIAGNOSIS — D649 Anemia, unspecified: Secondary | ICD-10-CM | POA: Insufficient documentation

## 2020-04-07 DIAGNOSIS — Z79899 Other long term (current) drug therapy: Secondary | ICD-10-CM | POA: Insufficient documentation

## 2020-04-07 DIAGNOSIS — I10 Essential (primary) hypertension: Secondary | ICD-10-CM | POA: Diagnosis not present

## 2020-04-07 DIAGNOSIS — C50212 Malignant neoplasm of upper-inner quadrant of left female breast: Secondary | ICD-10-CM

## 2020-04-07 DIAGNOSIS — Z170 Estrogen receptor positive status [ER+]: Secondary | ICD-10-CM | POA: Diagnosis not present

## 2020-04-07 DIAGNOSIS — Z791 Long term (current) use of non-steroidal anti-inflammatories (NSAID): Secondary | ICD-10-CM | POA: Insufficient documentation

## 2020-04-07 DIAGNOSIS — Z79811 Long term (current) use of aromatase inhibitors: Secondary | ICD-10-CM | POA: Insufficient documentation

## 2020-04-07 LAB — CBC WITH DIFFERENTIAL/PLATELET
Abs Immature Granulocytes: 0.01 10*3/uL (ref 0.00–0.07)
Basophils Absolute: 0 10*3/uL (ref 0.0–0.1)
Basophils Relative: 1 %
Eosinophils Absolute: 0.1 10*3/uL (ref 0.0–0.5)
Eosinophils Relative: 1 %
HCT: 33.7 % — ABNORMAL LOW (ref 36.0–46.0)
Hemoglobin: 11.3 g/dL — ABNORMAL LOW (ref 12.0–15.0)
Immature Granulocytes: 0 %
Lymphocytes Relative: 24 %
Lymphs Abs: 1.5 10*3/uL (ref 0.7–4.0)
MCH: 30.2 pg (ref 26.0–34.0)
MCHC: 33.5 g/dL (ref 30.0–36.0)
MCV: 90.1 fL (ref 80.0–100.0)
Monocytes Absolute: 0.4 10*3/uL (ref 0.1–1.0)
Monocytes Relative: 6 %
Neutro Abs: 4.3 10*3/uL (ref 1.7–7.7)
Neutrophils Relative %: 68 %
Platelets: 204 10*3/uL (ref 150–400)
RBC: 3.74 MIL/uL — ABNORMAL LOW (ref 3.87–5.11)
RDW: 13.1 % (ref 11.5–15.5)
WBC: 6.3 10*3/uL (ref 4.0–10.5)
nRBC: 0 % (ref 0.0–0.2)

## 2020-04-07 LAB — COMPREHENSIVE METABOLIC PANEL
ALT: 11 U/L (ref 0–44)
AST: 17 U/L (ref 15–41)
Albumin: 3.5 g/dL (ref 3.5–5.0)
Alkaline Phosphatase: 87 U/L (ref 38–126)
Anion gap: 10 (ref 5–15)
BUN: 14 mg/dL (ref 8–23)
CO2: 25 mmol/L (ref 22–32)
Calcium: 9.1 mg/dL (ref 8.9–10.3)
Chloride: 104 mmol/L (ref 98–111)
Creatinine, Ser: 0.75 mg/dL (ref 0.44–1.00)
GFR, Estimated: >60 mL/min (ref >60)
Glucose, Bld: 115 mg/dL — ABNORMAL HIGH (ref 70–99)
Potassium: 3.8 mmol/L (ref 3.5–5.1)
Sodium: 139 mmol/L (ref 135–145)
Total Bilirubin: 0.5 mg/dL (ref 0.3–1.2)
Total Protein: 8 g/dL (ref 6.5–8.1)

## 2020-04-07 NOTE — Progress Notes (Signed)
Patient reports covid vaccine - Moderna. Series started in March 2021. She does not recall the exact vaccination dates.

## 2020-04-07 NOTE — Progress Notes (Signed)
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  Stage I status post lumpectomy [Dr. Burnett/Dr.Corcoran] followed by radiation; currently on aromatase inhibitor  #March 2019 bone density normal limits  DIAGNOSIS: BREAST CANCER  STAGE:  I   ;GOALS: cure  CURRENT/MOST RECENT THERAPY : letrozole.     Carcinoma of upper-inner quadrant of left breast in female, estrogen receptor positive ([place])  02/01/2017 Initial Diagnosis   Carcinoma of upper-inner quadrant of left breast in female, estrogen receptor positive ([place])     HISTORY OF PRESENTING ILLNESS:  Joanne Collier 80 y.o.  female stage I ER PR positive breast cancer currently on aromatase inhibitor is here for follow-up.  In the interim in December 2021 patient had a left breast biopsy given abnormal mammogram.  Patient today denies any unusual aches and joint pains.  Denies any headaches but denies any shortness of breath or cough.    Review of Systems  Constitutional: Negative for chills, diaphoresis, fever, malaise/fatigue and weight loss.  HENT: Negative for nosebleeds and sore throat.   Eyes: Negative for double vision.  Respiratory: Negative for cough, hemoptysis, sputum production, shortness of breath and wheezing.   Cardiovascular: Negative for chest pain, palpitations, orthopnea and leg swelling.  Gastrointestinal: Negative for abdominal pain, blood in stool, constipation, diarrhea, heartburn, melena, nausea and  vomiting.  Genitourinary: Negative for dysuria, frequency and urgency.  Musculoskeletal: Positive for back pain and joint pain.  Skin: Negative.  Negative for itching and rash.  Neurological: Negative for dizziness, tingling, focal weakness, weakness and headaches.  Endo/Heme/Allergies: Does not bruise/bleed easily.  Psychiatric/Behavioral: Negative for depression. The patient is not nervous/anxious and does not have insomnia.      MEDICAL HISTORY:  Past Medical History:  Diagnosis Date  . Arthritis    LEFT KNEE  . Breast cancer ([place]) 2019   left breast  . Cancer ([place])   . Glaucoma   . Hypertension   . Personal history of radiation therapy     SURGICAL HISTORY: Past Surgical History:  Procedure Laterality Date  . BREAST BIOPSY Left 02/01/2017   INVASIVE MAMMARY CARCINOMA bx  . BREAST LUMPECTOMY  02/23/2017  . ENDOMETRIAL BIOPSY    . EYE SURGERY Bilateral   . PARTIAL MASTECTOMY WITH NEEDLE LOCALIZATION Left 02/23/2017   Procedure: PARTIAL MASTECTOMY WITH NEEDLE LOCALIZATION;  Surgeon: Leonie Green, MD;  Location: [facility] ORS;  Service: General;  Laterality: Left;  . SENTINEL NODE BIOPSY Left 02/23/2017   Procedure: SENTINEL NODE BIOPSY;  Surgeon: Leonie Green, MD;  Location: [facility] ORS;  Service: General;  Laterality: Left;    SOCIAL HISTORY: Social History   Socioeconomic History  . Marital status: Married    Spouse name: Not on file  . Number of children: Not on file  . Years of education: Not on file  . Highest education level: Not on file  Occupational History  . Not on file  Tobacco Use  . Smoking status: Never Smoker  . Smokeless tobacco: Never Used  Vaping Use  . Vaping Use: Never used  Substance and Sexual Activity  .  Alcohol use: No  . Drug use: No  . Sexual activity: Not on file  Other Topics Concern  . Not on file  Social History Narrative  . Not on file   Social Determinants of Health   Financial Resource Strain: Not on file  Food  Insecurity: Not on file  Transportation Needs: Not on file  Physical Activity: Not on file  Stress: Not on file  Social Connections: Not on file  Intimate Partner Violence: Not on file    FAMILY HISTORY: Family History  Problem Relation Age of Onset  . Cancer Mother 90       ovarian  . Cancer Brother 30       colon  . Breast cancer Neg Hx     ALLERGIES:  has No Known Allergies.  MEDICATIONS:  Current Outpatient Medications  Medication Sig Dispense Refill  . aspirin EC 81 MG tablet Take 81 mg by mouth daily.    Marland Kitchen atenolol (TENORMIN) 25 MG tablet Take 25 mg by mouth every morning.     . dorzolamide-timolol (COSOPT) 22.3-6.8 MG/ML ophthalmic solution 1 drop 2 (two) times daily.    Marland Kitchen ibuprofen (ADVIL,MOTRIN) 200 MG tablet Take 200 mg by mouth every 6 (six) hours as needed for headache or moderate pain.    Marland Kitchen letrozole (FEMARA) 2.5 MG tablet TAKE 1 TABLET(2.5 MG) BY MOUTH DAILY 90 tablet 3  . TRAVATAN Z 0.004 % SOLN ophthalmic solution Place 1 drop into the right eye at bedtime.  1   No current facility-administered medications for this visit.   PHYSICAL EXAMINATION: ECOG PERFORMANCE STATUS: 0 - Asymptomatic  Vitals:   04/07/20 1013  BP: 136/65  Pulse: 64  Resp: 20  Temp: 97.6 F (36.4 C)   Filed Weights   04/07/20 1013  Weight: 172 lb 9.6 oz (78.3 kg)    Physical Exam HENT:     Head: Normocephalic and atraumatic.     Mouth/Throat:     Pharynx: No oropharyngeal exudate.  Eyes:     Pupils: Pupils are equal, round, and reactive to light.  Cardiovascular:     Rate and Rhythm: Normal rate and regular rhythm.  Pulmonary:     Effort: Pulmonary effort is normal. No respiratory distress.     Breath sounds: Normal breath sounds. No wheezing.  Abdominal:     General: Bowel sounds are normal. There is no distension.     Palpations: Abdomen is soft. There is no mass.     Tenderness: There is no abdominal tenderness. There is no guarding or rebound.  Musculoskeletal:         General: No tenderness. Normal range of motion.     Cervical back: Normal range of motion and neck supple.  Skin:    General: Skin is warm.     Comments: Right and left BREAST exam (in the presence of nurse)- no unusual skin changes or dominant masses felt. Surgical scars noted.    Neurological:     Mental Status: She is alert and oriented to person, place, and time.  Psychiatric:        Mood and Affect: Affect normal.    LABORATORY DATA:  I have reviewed the data as listed Lab Results  Component Value Date   WBC 6.3 04/07/2020   HGB 11.3 (L) 04/07/2020   HCT 33.7 (L) 04/07/2020   MCV 90.1 04/07/2020   PLT 204 04/07/2020   Recent Labs    04/09/19 0947 10/08/19 0903 04/07/20 0954  NA 139 138  139  K 3.9 3.6 3.8  CL 103 103 104  CO2 '27 25 25  ' GLUCOSE 126* 122* 115*  BUN '16 13 14  ' CREATININE 0.80 0.79 0.75  CALCIUM 9.2 8.8* 9.1  GFRNONAA >60 >60 >60  GFRAA >60 >60  --   PROT 8.3* 8.1 8.0  ALBUMIN 3.7 3.6 3.5  AST '20 17 17  ' ALT '17 10 11  ' ALKPHOS 86 90 87  BILITOT 0.4 0.4 0.5    RADIOGRAPHIC STUDIES: I have personally reviewed the radiological images as listed and agreed with the findings in the report. No results found.  ASSESSMENT & PLAN:   Carcinoma of upper-inner quadrant of left breast in female, estrogen receptor positive ([place]) #Stage I breast cancer-ER negative PR positive HER-2/neu negative breast cancer currently on letrozole [? 2024].  STABLE; No evidence recurrence.  Await mammogram-December 2022.  #December 2021 left breast abnormality status post biopsy-negative for malignancy; fibroadenoma.  Reviewed the pathology with the patient.  #Chronic mild anemia chronic hemoglobin 11.3. [ colonoscopy- 2008; cologuard [Dr.schermerhorn; 2019]-NEG. STABLE.  # dec 2021- BMD -score of -0.7. continue calcium vitamin D. STABLE.  Reviewed the bone density in detail.   # DISPOSITION: # follow up in 6 months [AM appt if possible]- MD; cbc/cmp-Dr.B  All  questions were answered. The patient knows to call the clinic with any problems, questions or concerns.    Cammie Sickle, MD 04/07/2020 12:04 PM

## 2020-04-07 NOTE — Progress Notes (Signed)
RN Chaperoned provider with Breast Exam.   

## 2020-04-07 NOTE — Assessment & Plan Note (Addendum)
#  Stage I breast cancer-ER negative PR positive HER-2/neu negative breast cancer currently on letrozole [? 2024].  STABLE; No evidence recurrence.  Await mammogram-December 2022.  #December 2021 left breast abnormality status post biopsy-negative for malignancy; fibroadenoma.  Reviewed the pathology with the patient.  #Chronic mild anemia chronic hemoglobin 11.3. [ colonoscopy- 2008; cologuard [Dr.schermerhorn; 2019]-NEG. STABLE.  # dec 2021- BMD -score of -0.7. continue calcium vitamin D. STABLE.  Reviewed the bone density in detail.   # DISPOSITION: # follow up in 6 months [AM appt if possible]- MD; cbc/cmp-Dr.B

## 2020-05-05 ENCOUNTER — Other Ambulatory Visit: Payer: Self-pay

## 2020-05-05 ENCOUNTER — Encounter (INDEPENDENT_AMBULATORY_CARE_PROVIDER_SITE_OTHER): Payer: Medicare Other | Admitting: Ophthalmology

## 2020-05-05 DIAGNOSIS — H43811 Vitreous degeneration, right eye: Secondary | ICD-10-CM | POA: Diagnosis not present

## 2020-05-05 DIAGNOSIS — I10 Essential (primary) hypertension: Secondary | ICD-10-CM

## 2020-05-05 DIAGNOSIS — H35033 Hypertensive retinopathy, bilateral: Secondary | ICD-10-CM

## 2020-05-05 DIAGNOSIS — H35371 Puckering of macula, right eye: Secondary | ICD-10-CM

## 2020-05-21 ENCOUNTER — Encounter: Payer: Self-pay | Admitting: Radiation Oncology

## 2020-05-21 ENCOUNTER — Ambulatory Visit
Admission: RE | Admit: 2020-05-21 | Discharge: 2020-05-21 | Disposition: A | Discharge status: Home / Self Care | Payer: Medicare Other | Source: Ambulatory Visit | Attending: Radiation Oncology | Admitting: Radiation Oncology

## 2020-05-21 VITALS — BP 142/72 | HR 66 | Temp 97.1°F | Wt 172.0 lb

## 2020-05-21 DIAGNOSIS — Z79811 Long term (current) use of aromatase inhibitors: Secondary | ICD-10-CM | POA: Diagnosis not present

## 2020-05-21 DIAGNOSIS — C50212 Malignant neoplasm of upper-inner quadrant of left female breast: Secondary | ICD-10-CM | POA: Insufficient documentation

## 2020-05-21 DIAGNOSIS — Z170 Estrogen receptor positive status [ER+]: Secondary | ICD-10-CM | POA: Diagnosis not present

## 2020-05-21 DIAGNOSIS — Z923 Personal history of irradiation: Secondary | ICD-10-CM | POA: Diagnosis not present

## 2020-05-21 NOTE — Progress Notes (Signed)
Radiation Oncology Follow up Note  Name: Joanne Collier   Date:   05/21/2020 MRN:  [id number] DOB: 1940/07/14    This 80 y.o. female presents to the clinic today for 3-year follow-up status post whole breast radiation to her left breast for stage I ER negative PR slightly positive invasive mammary carcinoma.  REFERRING PROVIDER: Derinda Late, MD  HPI: Patient is a 80 year old female now at 3 years having completed whole breast radiation to her left breast for stage I ER negative PR slightly positive invasive mammary carcinoma seen today in routine follow-up she is doing well she specifically denies breast tenderness cough or bone pain..  Back in December she had mammogram and ultrasound showing persistent 3 mm mass in the lateral anterior portion of left breast which was biopsied and was a fibroma negative for atypia and malignancy.  She is currently on Femara tolerant well without side effect.  COMPLICATIONS OF TREATMENT: none  FOLLOW UP COMPLIANCE: keeps appointments   PHYSICAL EXAM:  BP (!) 142/72   Pulse 66   Temp (!) 97.1 F (36.2 C) (Tympanic)   Wt 172 lb (78 kg)   BMI 27.76 kg/m  Lungs are clear to A&P cardiac examination essentially unremarkable with regular rate and rhythm. No dominant mass or nodularity is noted in either breast in 2 positions examined. Incision is well-healed. No axillary or supraclavicular adenopathy is appreciated. Cosmetic result is excellent.  Well-developed well-nourished patient in NAD. HEENT reveals PERLA, EOMI, discs not visualized.  Oral cavity is clear. No oral mucosal lesions are identified. Neck is clear without evidence of cervical or supraclavicular adenopathy. Lungs are clear to A&P. Cardiac examination is essentially unremarkable with regular rate and rhythm without murmur rub or thrill. Abdomen is benign with no organomegaly or masses noted. Motor sensory and DTR levels are equal and symmetric in the upper and lower extremities. Cranial nerves II  through XII are grossly intact. Proprioception is intact. No peripheral adenopathy or edema is identified. No motor or sensory levels are noted. Crude visual fields are within normal range.  RADIOLOGY RESULTS: Mammogram and ultrasound reviewed compatible with above-stated findings pathology also reviewed  PLAN: Present time patient now 3 years out from whole breast radiation doing well with no evidence of disease.  I am pleased with her overall progress.  I have asked to see her back in 1 year for follow-up.  Patient knows to call with any concerns.  I would like to take this opportunity to thank you for allowing me to participate in the care of your patient.Noreene Filbert, MD

## 2020-06-23 ENCOUNTER — Other Ambulatory Visit: Payer: Self-pay | Admitting: Obstetrics and Gynecology

## 2020-06-23 DIAGNOSIS — Z1231 Encounter for screening mammogram for malignant neoplasm of breast: Secondary | ICD-10-CM

## 2020-10-06 ENCOUNTER — Inpatient Hospital Stay: Payer: Medicare Other | Attending: Internal Medicine

## 2020-10-06 ENCOUNTER — Other Ambulatory Visit: Payer: Self-pay

## 2020-10-06 ENCOUNTER — Encounter: Payer: Self-pay | Admitting: Internal Medicine

## 2020-10-06 ENCOUNTER — Inpatient Hospital Stay (HOSPITAL_BASED_OUTPATIENT_CLINIC_OR_DEPARTMENT_OTHER): Payer: Medicare Other | Admitting: Internal Medicine

## 2020-10-06 DIAGNOSIS — D649 Anemia, unspecified: Secondary | ICD-10-CM | POA: Insufficient documentation

## 2020-10-06 DIAGNOSIS — D509 Iron deficiency anemia, unspecified: Secondary | ICD-10-CM

## 2020-10-06 DIAGNOSIS — Z170 Estrogen receptor positive status [ER+]: Secondary | ICD-10-CM

## 2020-10-06 DIAGNOSIS — C50212 Malignant neoplasm of upper-inner quadrant of left female breast: Secondary | ICD-10-CM | POA: Diagnosis present

## 2020-10-06 LAB — IRON AND TIBC
Iron: 68 ug/dL (ref 28–170)
Saturation Ratios: 24 % (ref 10.4–31.8)
TIBC: 286 ug/dL (ref 250–450)
UIBC: 218 ug/dL

## 2020-10-06 LAB — COMPREHENSIVE METABOLIC PANEL
ALT: 10 U/L (ref 0–44)
AST: 18 U/L (ref 15–41)
Albumin: 3.6 g/dL (ref 3.5–5.0)
Alkaline Phosphatase: 98 U/L (ref 38–126)
Anion gap: 6 (ref 5–15)
BUN: 15 mg/dL (ref 8–23)
CO2: 26 mmol/L (ref 22–32)
Calcium: 9 mg/dL (ref 8.9–10.3)
Chloride: 105 mmol/L (ref 98–111)
Creatinine, Ser: 0.84 mg/dL (ref 0.44–1.00)
GFR, Estimated: >60 mL/min (ref >60)
Glucose, Bld: 109 mg/dL — ABNORMAL HIGH (ref 70–99)
Potassium: 3.8 mmol/L (ref 3.5–5.1)
Sodium: 137 mmol/L (ref 135–145)
Total Bilirubin: 0.2 mg/dL — ABNORMAL LOW (ref 0.3–1.2)
Total Protein: 7.9 g/dL (ref 6.5–8.1)

## 2020-10-06 LAB — CBC WITH DIFFERENTIAL/PLATELET
Abs Immature Granulocytes: 0.02 10*3/uL (ref 0.00–0.07)
Basophils Absolute: 0 10*3/uL (ref 0.0–0.1)
Basophils Relative: 1 %
Eosinophils Absolute: 0.1 10*3/uL (ref 0.0–0.5)
Eosinophils Relative: 2 %
HCT: 34.1 % — ABNORMAL LOW (ref 36.0–46.0)
Hemoglobin: 11.3 g/dL — ABNORMAL LOW (ref 12.0–15.0)
Immature Granulocytes: 0 %
Lymphocytes Relative: 20 %
Lymphs Abs: 1.3 10*3/uL (ref 0.7–4.0)
MCH: 29.8 pg (ref 26.0–34.0)
MCHC: 33.1 g/dL (ref 30.0–36.0)
MCV: 90 fL (ref 80.0–100.0)
Monocytes Absolute: 0.4 10*3/uL (ref 0.1–1.0)
Monocytes Relative: 6 %
Neutro Abs: 4.6 10*3/uL (ref 1.7–7.7)
Neutrophils Relative %: 71 %
Platelets: 222 10*3/uL (ref 150–400)
RBC: 3.79 MIL/uL — ABNORMAL LOW (ref 3.87–5.11)
RDW: 13.2 % (ref 11.5–15.5)
WBC: 6.4 10*3/uL (ref 4.0–10.5)
nRBC: 0 % (ref 0.0–0.2)

## 2020-10-06 LAB — FERRITIN: Ferritin: 179 ng/mL (ref 11–307)

## 2020-10-06 NOTE — Assessment & Plan Note (Addendum)
#  Stage I breast cancer-ER negative PR positive HER-2/neu negative breast cancer currently on letrozole [? 2024].  STABLE.; No evidence recurrence.  Await mammogram [Dr. Schermerhorn]-December 2022.  #Chronic mild anemia chronic hemoglobin 11.3. [ colonoscopy- 2008; cologuard [Dr.schermerhorn; 2019]-NEG. STABLE.  # dec 2021- BMD -score of -0.7. continue calcium vitamin D. STABLE.  Reviewed the bone density in detail. STABLE.  # DISPOSITION: # follow up in 6 months [AM appt if possible]- MD; cbc/cmp-Dr.B

## 2020-10-06 NOTE — Progress Notes (Signed)
RN Chaperoned provider with Breast Exam.   

## 2020-10-06 NOTE — Progress Notes (Signed)
Pt in for 6 month follow up, reports taking letrozole as ordered. Pt denies any concerns, states she does self breast exams.

## 2020-10-06 NOTE — Progress Notes (Signed)
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  Stage I status post lumpectomy [Dr. Burnett/Dr.Corcoran] followed by radiation; currently on aromatase inhibitor  #March 2019 bone density normal limits  DIAGNOSIS: BREAST CANCER  STAGE:  I   ;GOALS: cure  CURRENT/MOST RECENT THERAPY : letrozole.     Carcinoma of upper-inner quadrant of left breast in female, estrogen receptor positive ([place])  02/01/2017 Initial Diagnosis   Carcinoma of upper-inner quadrant of left breast in female, estrogen receptor positive ([place])     HISTORY OF PRESENTING ILLNESS:  Joanne Collier 80 y.o.  female stage I ER PR positive breast cancer currently on aromatase inhibitor is here for follow-up.  Patient denies any worsening joint pains or bone pain.  No headaches.  No shortness breath or cough.  Admits to compliance with the letrozole.  Review of Systems  Constitutional:  Negative for chills, diaphoresis, fever, malaise/fatigue and weight loss.  HENT:  Negative for nosebleeds and sore throat.   Eyes:  Negative for double vision.  Respiratory:  Negative for cough, hemoptysis, sputum production, shortness of breath and wheezing.   Cardiovascular:  Negative for chest pain, palpitations, orthopnea and leg swelling.  Gastrointestinal:  Negative for abdominal pain, blood in stool, constipation, diarrhea, heartburn, melena, nausea and vomiting.  Genitourinary:  Negative for dysuria, frequency and urgency.   Musculoskeletal:  Positive for back pain and joint pain.  Skin: Negative.  Negative for itching and rash.  Neurological:  Negative for dizziness, tingling, focal weakness, weakness and headaches.  Endo/Heme/Allergies:  Does not bruise/bleed easily.  Psychiatric/Behavioral:  Negative for depression. The patient is not nervous/anxious and does not have insomnia.     MEDICAL HISTORY:  Past Medical History:  Diagnosis Date   Arthritis    LEFT KNEE   Breast cancer ([place]) 2019   left breast   Cancer ([place])    Glaucoma    Hypertension    Personal history of radiation therapy     SURGICAL HISTORY: Past Surgical History:  Procedure Laterality Date   BREAST BIOPSY Left 02/01/2017   INVASIVE MAMMARY CARCINOMA bx   BREAST LUMPECTOMY  02/23/2017   ENDOMETRIAL BIOPSY     EYE SURGERY Bilateral    PARTIAL MASTECTOMY WITH NEEDLE LOCALIZATION Left 02/23/2017   Procedure: PARTIAL MASTECTOMY WITH NEEDLE LOCALIZATION;  Surgeon: Leonie Green, MD;  Location: [facility] ORS;  Service: General;  Laterality: Left;   SENTINEL NODE BIOPSY Left 02/23/2017   Procedure: SENTINEL NODE BIOPSY;  Surgeon: Leonie Green, MD;  Location: [facility] ORS;  Service: General;  Laterality: Left;    SOCIAL HISTORY: Social History   Socioeconomic History   Marital status: Married    Spouse name: Not on file   Number of children: Not on file   Years of education: Not on file   Highest education level: Not on file  Occupational History   Not on file  Tobacco Use   Smoking status: Never   Smokeless tobacco: Never  Vaping Use   Vaping Use: Never used  Substance and Sexual Activity   Alcohol use: No   Drug  use: No   Sexual activity: Not on file  Other Topics Concern   Not on file  Social History Narrative   Not on file   Social Determinants of Health   Financial Resource Strain: Not on file  Food Insecurity: Not on file  Transportation Needs: Not on file  Physical Activity: Not on file  Stress: Not on  file  Social Connections: Not on file  Intimate Partner Violence: Not on file    FAMILY HISTORY: Family History  Problem Relation Age of Onset   Cancer Mother 53       ovarian   Cancer Brother 56       colon   Breast cancer Neg Hx     ALLERGIES:  has No Known Allergies.  MEDICATIONS:  Current Outpatient Medications  Medication Sig Dispense Refill   aspirin EC 81 MG tablet Take 81 mg by mouth daily.     atenolol (TENORMIN) 25 MG tablet Take 25 mg by mouth every morning.      dorzolamide-timolol (COSOPT) 22.3-6.8 MG/ML ophthalmic solution 1 drop 2 (two) times daily.     ibuprofen (ADVIL,MOTRIN) 200 MG tablet Take 200 mg by mouth every 6 (six) hours as needed for headache or moderate pain.     letrozole (FEMARA) 2.5 MG tablet TAKE 1 TABLET(2.5 MG) BY MOUTH DAILY 90 tablet 3   TRAVATAN Z 0.004 % SOLN ophthalmic solution Place 1 drop into the right eye at bedtime.  1   No current facility-administered medications for this visit.   PHYSICAL EXAMINATION: ECOG PERFORMANCE STATUS: 0 - Asymptomatic  Vitals:   10/06/20 1059  BP: 124/61  Pulse: (!) 57  Resp: 18  Temp: 97.8 F (36.6 C)  SpO2: 98%   Filed Weights   10/06/20 1059  Weight: 169 lb (76.7 kg)    Physical Exam HENT:     Head: Normocephalic and atraumatic.     Mouth/Throat:     Pharynx: No oropharyngeal exudate.  Eyes:     Pupils: Pupils are equal, round, and reactive to light.  Cardiovascular:     Rate and Rhythm: Normal rate and regular rhythm.  Pulmonary:     Effort: Pulmonary effort is normal. No respiratory distress.     Breath sounds: Normal breath sounds. No wheezing.  Abdominal:     General: Bowel sounds are normal. There is no distension.     Palpations: Abdomen is soft. There is no mass.     Tenderness: There is no abdominal tenderness. There is no guarding or rebound.  Musculoskeletal:        General: No tenderness. Normal range of motion.     Cervical back: Normal range of motion and neck  supple.  Skin:    General: Skin is warm.     Comments: Right and left BREAST exam [in the presence of nurse]- no unusual skin changes or dominant masses felt. Surgical scars noted.    Neurological:     Mental Status: She is alert and oriented to person, place, and time.  Psychiatric:        Mood and Affect: Affect normal.   LABORATORY DATA:  I have reviewed the data as listed Lab Results  Component Value Date   WBC 6.4 10/06/2020   HGB 11.3 (L) 10/06/2020   HCT 34.1 (L) 10/06/2020   MCV 90.0 10/06/2020   PLT 222 10/06/2020   Recent Labs    10/08/19 0903 04/07/20 0954 10/06/20 0958  NA 138 139 137  K 3.6 3.8  3.8  CL 103 104 105  CO2 '25 25 26  ' GLUCOSE 122* 115* 109*  BUN '13 14 15  ' CREATININE 0.79 0.75 0.84  CALCIUM 8.8* 9.1 9.0  GFRNONAA >60 >60 >60  GFRAA >60  --   --   PROT 8.1 8.0 7.9  ALBUMIN 3.6 3.5 3.6  AST '17 17 18  ' ALT '10 11 10  ' ALKPHOS 90 87 98  BILITOT 0.4 0.5 0.2*    RADIOGRAPHIC STUDIES: I have personally reviewed the radiological images as listed and agreed with the findings in the report. No results found.  ASSESSMENT & PLAN:   Carcinoma of upper-inner quadrant of left breast in female, estrogen receptor positive ([place]) #Stage I breast cancer-ER negative PR positive HER-2/neu negative breast cancer currently on letrozole [? 2024].  STABLE.; No evidence recurrence.  Await mammogram [Dr. Schermerhorn]-December 2022.  #Chronic mild anemia chronic hemoglobin 11.3. [ colonoscopy- 2008; cologuard [Dr.schermerhorn; 2019]-NEG. STABLE.  # dec 2021- BMD -score of -0.7. continue calcium vitamin D. STABLE.  Reviewed the bone density in detail. STABLE.  # DISPOSITION: # follow up in 6 months [AM appt if possible]- MD; cbc/cmp-Dr.B  All questions were answered. The patient knows to call the clinic with any problems, questions or concerns.    Cammie Sickle, MD 10/06/2020 1:26 PM

## 2021-01-01 ENCOUNTER — Other Ambulatory Visit: Payer: Self-pay | Admitting: Internal Medicine

## 2021-01-19 ENCOUNTER — Other Ambulatory Visit: Payer: Self-pay

## 2021-01-19 ENCOUNTER — Ambulatory Visit
Admission: RE | Admit: 2021-01-19 | Discharge: 2021-01-19 | Disposition: A | Discharge status: Home / Self Care | Payer: Medicare Other | Source: Ambulatory Visit | Attending: Obstetrics and Gynecology | Admitting: Obstetrics and Gynecology

## 2021-01-19 DIAGNOSIS — Z1231 Encounter for screening mammogram for malignant neoplasm of breast: Secondary | ICD-10-CM | POA: Diagnosis not present

## 2021-03-10 ENCOUNTER — Encounter: Payer: Self-pay | Admitting: *Deleted

## 2021-03-30 ENCOUNTER — Other Ambulatory Visit: Payer: Self-pay | Admitting: *Deleted

## 2021-03-30 DIAGNOSIS — C50212 Malignant neoplasm of upper-inner quadrant of left female breast: Secondary | ICD-10-CM

## 2021-04-06 ENCOUNTER — Inpatient Hospital Stay: Payer: Medicare Other | Attending: Internal Medicine

## 2021-04-06 ENCOUNTER — Inpatient Hospital Stay (HOSPITAL_BASED_OUTPATIENT_CLINIC_OR_DEPARTMENT_OTHER): Payer: Medicare Other | Admitting: Internal Medicine

## 2021-04-06 ENCOUNTER — Encounter: Payer: Self-pay | Admitting: Internal Medicine

## 2021-04-06 ENCOUNTER — Other Ambulatory Visit: Payer: Self-pay

## 2021-04-06 DIAGNOSIS — Z170 Estrogen receptor positive status [ER+]: Secondary | ICD-10-CM

## 2021-04-06 DIAGNOSIS — I10 Essential (primary) hypertension: Secondary | ICD-10-CM | POA: Diagnosis not present

## 2021-04-06 DIAGNOSIS — Z853 Personal history of malignant neoplasm of breast: Secondary | ICD-10-CM | POA: Insufficient documentation

## 2021-04-06 DIAGNOSIS — Z800 Family history of malignant neoplasm of digestive organs: Secondary | ICD-10-CM | POA: Insufficient documentation

## 2021-04-06 DIAGNOSIS — C50212 Malignant neoplasm of upper-inner quadrant of left female breast: Secondary | ICD-10-CM

## 2021-04-06 DIAGNOSIS — D649 Anemia, unspecified: Secondary | ICD-10-CM | POA: Insufficient documentation

## 2021-04-06 DIAGNOSIS — Z8041 Family history of malignant neoplasm of ovary: Secondary | ICD-10-CM | POA: Diagnosis not present

## 2021-04-06 LAB — CBC WITH DIFFERENTIAL/PLATELET
Abs Immature Granulocytes: 0.02 10*3/uL (ref 0.00–0.07)
Basophils Absolute: 0 10*3/uL (ref 0.0–0.1)
Basophils Relative: 0 %
Eosinophils Absolute: 0.1 10*3/uL (ref 0.0–0.5)
Eosinophils Relative: 1 %
HCT: 34.1 % — ABNORMAL LOW (ref 36.0–46.0)
Hemoglobin: 11.4 g/dL — ABNORMAL LOW (ref 12.0–15.0)
Immature Granulocytes: 0 %
Lymphocytes Relative: 19 %
Lymphs Abs: 1.2 10*3/uL (ref 0.7–4.0)
MCH: 30.2 pg (ref 26.0–34.0)
MCHC: 33.4 g/dL (ref 30.0–36.0)
MCV: 90.5 fL (ref 80.0–100.0)
Monocytes Absolute: 0.3 10*3/uL (ref 0.1–1.0)
Monocytes Relative: 5 %
Neutro Abs: 4.7 10*3/uL (ref 1.7–7.7)
Neutrophils Relative %: 75 %
Platelets: 203 10*3/uL (ref 150–400)
RBC: 3.77 MIL/uL — ABNORMAL LOW (ref 3.87–5.11)
RDW: 13.4 % (ref 11.5–15.5)
WBC: 6.4 10*3/uL (ref 4.0–10.5)
nRBC: 0 % (ref 0.0–0.2)

## 2021-04-06 LAB — COMPREHENSIVE METABOLIC PANEL
ALT: 10 U/L (ref 0–44)
AST: 17 U/L (ref 15–41)
Albumin: 3.5 g/dL (ref 3.5–5.0)
Alkaline Phosphatase: 103 U/L (ref 38–126)
Anion gap: 6 (ref 5–15)
BUN: 13 mg/dL (ref 8–23)
CO2: 26 mmol/L (ref 22–32)
Calcium: 8.8 mg/dL — ABNORMAL LOW (ref 8.9–10.3)
Chloride: 105 mmol/L (ref 98–111)
Creatinine, Ser: 0.79 mg/dL (ref 0.44–1.00)
GFR, Estimated: >60 mL/min (ref >60)
Glucose, Bld: 135 mg/dL — ABNORMAL HIGH (ref 70–99)
Potassium: 3.8 mmol/L (ref 3.5–5.1)
Sodium: 137 mmol/L (ref 135–145)
Total Bilirubin: 0.3 mg/dL (ref 0.3–1.2)
Total Protein: 7.8 g/dL (ref 6.5–8.1)

## 2021-04-06 NOTE — Progress Notes (Signed)
Patient denies new problems/concerns today.   °

## 2021-04-06 NOTE — Assessment & Plan Note (Addendum)
#  Stage I breast cancer-ER negative PR positive HER-2/neu negative breast cancer currently on letrozole [? 2024].  STABLE; No evidence recurrence.   Bil mammogram [Dr. Schermerhorn]-December 2022-mammogram reviewed WNL. ? ?# Chronic mild anemia chronic hemoglobin 11.4. [ colonoscopy- 2008; cologuard [Dr.schermerhorn; 2019]-NEG. STABLE. ? ?# dec 2021- BMD -score of -0.7.  Calcium 8.8.  Check vitamin D levels next.  Recommend calcium vitamin D. STABLE.  Reviewed the bone density in detail; will order BMD next visit [with mammo in dec 2023] STABLE. ? ?# Elevated BP- ? 160s; in office; but home better- monitor closely.  ? ?# DISPOSITION: ?# follow up in 6 months [AM appt if possible]- MD; cbc/cmp; 25 OH vit D levels-Dr.B ?

## 2021-04-06 NOTE — Progress Notes (Signed)
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  Stage I status post lumpectomy [Dr. Burnett/Dr.Corcoran] followed by radiation; currently on aromatase inhibitor  #March 2019 bone density normal limits  DIAGNOSIS: BREAST CANCER  STAGE:  I   ;GOALS: cure  CURRENT/MOST RECENT THERAPY : letrozole.     Carcinoma of upper-inner quadrant of left breast in female, estrogen receptor positive ([place])  02/01/2017 Initial Diagnosis   Carcinoma of upper-inner quadrant of left breast in female, estrogen receptor positive ([place])     HISTORY OF PRESENTING ILLNESS: Alone.  Ambulating independently. Joanne Collier 81 y.o.  female stage I ER PR positive breast cancer currently on aromatase inhibitor is here for follow-up.  Patient denies new problems/concerns today.  Patient denies any worsening joint pains or bone pain.  No headaches.  No shortness breath or cough.  Admits to compliance with the letrozole.  Review of Systems  Constitutional:  Negative for chills, diaphoresis, fever, malaise/fatigue and weight loss.  HENT:  Negative for nosebleeds and sore throat.   Eyes:  Negative for double vision.  Respiratory:  Negative for cough, hemoptysis, sputum production, shortness of breath and wheezing.   Cardiovascular:  Negative for chest pain, palpitations, orthopnea and leg swelling.  Gastrointestinal:  Negative for abdominal pain, blood in stool, constipation, diarrhea, heartburn, melena, nausea and  vomiting.  Genitourinary:  Negative for dysuria, frequency and urgency.  Musculoskeletal:  Positive for back pain and joint pain.  Skin: Negative.  Negative for itching and rash.  Neurological:  Negative for dizziness, tingling, focal weakness, weakness and headaches.  Endo/Heme/Allergies:  Does not bruise/bleed easily.  Psychiatric/Behavioral:  Negative for depression. The patient is not nervous/anxious and does not have insomnia.     MEDICAL HISTORY:  Past Medical History:  Diagnosis Date   Arthritis    LEFT KNEE   Breast cancer ([place]) 2019   left breast   Cancer ([place])    Glaucoma    Hypertension    Personal history of radiation therapy     SURGICAL HISTORY: Past Surgical History:  Procedure Laterality Date   BREAST BIOPSY Left 02/01/2017   INVASIVE MAMMARY CARCINOMA bx   BREAST BIOPSY Left 01/22/2020   [place] bx,  ribbon marker , fibroadenoma   BREAST LUMPECTOMY  02/23/2017   ENDOMETRIAL BIOPSY     EYE SURGERY Bilateral    PARTIAL MASTECTOMY WITH NEEDLE LOCALIZATION Left 02/23/2017   Procedure: PARTIAL MASTECTOMY WITH NEEDLE LOCALIZATION;  Surgeon: Leonie Green, MD;  Location: [facility] ORS;  Service: General;  Laterality: Left;   SENTINEL NODE BIOPSY Left 02/23/2017   Procedure: SENTINEL NODE BIOPSY;  Surgeon: Leonie Green, MD;  Location: [facility] ORS;  Service: General;  Laterality: Left;    SOCIAL HISTORY: Social History   Socioeconomic History   Marital status: Married    Spouse name: Not on file   Number of children: Not on file   Years of education: Not on file   Highest education level: Not on file  Occupational History   Not on file  Tobacco Use   Smoking status: Never   Smokeless tobacco:  Never  Vaping Use   Vaping Use: Never used  Substance and Sexual Activity   Alcohol use: No   Drug use: No   Sexual activity: Not on file  Other Topics Concern   Not on file  Social History Narrative   Not on file   Social Determinants of Health    Financial Resource Strain: Not on file  Food Insecurity: Not on file  Transportation Needs: Not on file  Physical Activity: Not on file  Stress: Not on file  Social Connections: Not on file  Intimate Partner Violence: Not on file    FAMILY HISTORY: Family History  Problem Relation Age of Onset   Cancer Mother 46       ovarian   Cancer Brother 41       colon   Breast cancer Neg Hx     ALLERGIES:  has No Known Allergies.  MEDICATIONS:  Current Outpatient Medications  Medication Sig Dispense Refill   aspirin EC 81 MG tablet Take 81 mg by mouth daily.     atenolol (TENORMIN) 25 MG tablet Take 25 mg by mouth every morning.      dorzolamide-timolol (COSOPT) 22.3-6.8 MG/ML ophthalmic solution 1 drop 2 (two) times daily.     ibuprofen (ADVIL,MOTRIN) 200 MG tablet Take 200 mg by mouth every 6 (six) hours as needed for headache or moderate pain.     letrozole (FEMARA) 2.5 MG tablet TAKE 1 TABLET(2.5 MG) BY MOUTH DAILY 90 tablet 3   TRAVATAN Z 0.004 % SOLN ophthalmic solution Place 1 drop into the right eye at bedtime.  1   No current facility-administered medications for this visit.   PHYSICAL EXAMINATION: ECOG PERFORMANCE STATUS: 0 - Asymptomatic  Vitals:   04/06/21 0900  BP: (!) 160/80  Pulse: 62  Resp: 16  Temp: 98.5 F (36.9 C)   Filed Weights   04/06/21 0900  Weight: 168 lb (76.2 kg)    Physical Exam HENT:     Head: Normocephalic and atraumatic.     Mouth/Throat:     Pharynx: No oropharyngeal exudate.  Eyes:     Pupils: Pupils are equal, round, and reactive to light.  Cardiovascular:     Rate and Rhythm: Normal rate and regular rhythm.  Pulmonary:     Effort: Pulmonary effort is normal. No respiratory distress.     Breath sounds: Normal breath sounds. No wheezing.  Abdominal:     General: Bowel sounds are normal. There is no distension.     Palpations: Abdomen is soft. There is no mass.     Tenderness: There is no abdominal tenderness. There is no  guarding or rebound.  Musculoskeletal:        General: No tenderness. Normal range of motion.     Cervical back: Normal range of motion and neck supple.  Skin:    General: Skin is warm.     Comments: Right and left BREAST exam [in the presence of nurse]- no unusual skin changes or dominant masses felt. Surgical scars noted.    Neurological:     Mental Status: She is alert and oriented to person, place, and time.  Psychiatric:        Mood and Affect: Affect normal.   LABORATORY DATA:  I have reviewed the data as listed Lab Results  Component Value Date   WBC 6.4 04/06/2021   HGB 11.4 (L) 04/06/2021   HCT 34.1 (L) 04/06/2021   MCV 90.5 04/06/2021   PLT 203 04/06/2021  Recent Labs    04/07/20 0954 10/06/20 0958 04/06/21 0939  NA 139 137 137  K 3.8 3.8 3.8  CL 104 105 105  CO2 '25 26 26  ' GLUCOSE 115* 109* 135*  BUN '14 15 13  ' CREATININE 0.75 0.84 0.79  CALCIUM 9.1 9.0 8.8*  GFRNONAA >60 >60 >60  PROT 8.0 7.9 7.8  ALBUMIN 3.5 3.6 3.5  AST '17 18 17  ' ALT '11 10 10  ' ALKPHOS 87 98 103  BILITOT 0.5 0.2* 0.3    RADIOGRAPHIC STUDIES: I have personally reviewed the radiological images as listed and agreed with the findings in the report. No results found.  ASSESSMENT & PLAN:   Carcinoma of upper-inner quadrant of left breast in female, estrogen receptor positive ([place]) #Stage I breast cancer-ER negative PR positive HER-2/neu negative breast cancer currently on letrozole [? 2024].  STABLE; No evidence recurrence.   Bil mammogram [Dr. Schermerhorn]-December 2022-mammogram reviewed WNL.  # Chronic mild anemia chronic hemoglobin 11.4. [ colonoscopy- 2008; cologuard [Dr.schermerhorn; 2019]-NEG. STABLE.  # dec 2021- BMD -score of -0.7.  Calcium 8.8.  Check vitamin D levels next.  Recommend calcium vitamin D. STABLE.  Reviewed the bone density in detail; will order BMD next visit [with mammo in dec 2023] STABLE.  # Elevated BP- ? 160s; in office; but home better- monitor closely.    # DISPOSITION: # follow up in 6 months [AM appt if possible]- MD; cbc/cmp; 25 [place] vit D levels-Dr.B  All questions were answered. The patient knows to call the clinic with any problems, questions or concerns.    Cammie Sickle, MD 04/06/2021 10:52 AM

## 2021-05-03 ENCOUNTER — Encounter (INDEPENDENT_AMBULATORY_CARE_PROVIDER_SITE_OTHER): Payer: Medicare Other | Admitting: Ophthalmology

## 2021-05-10 ENCOUNTER — Encounter (INDEPENDENT_AMBULATORY_CARE_PROVIDER_SITE_OTHER): Payer: Medicare Other | Admitting: Ophthalmology

## 2021-05-23 ENCOUNTER — Ambulatory Visit: Payer: Medicare Other | Admitting: Radiation Oncology

## 2021-05-30 ENCOUNTER — Encounter: Payer: Self-pay | Admitting: Radiation Oncology

## 2021-05-30 ENCOUNTER — Ambulatory Visit
Admission: RE | Admit: 2021-05-30 | Discharge: 2021-05-30 | Disposition: A | Discharge status: Home / Self Care | Payer: Medicare Other | Source: Ambulatory Visit | Attending: Radiation Oncology | Admitting: Radiation Oncology

## 2021-05-30 VITALS — BP 172/84 | Temp 98.2°F | Resp 18 | Ht 68.0 in | Wt 166.9 lb

## 2021-05-30 DIAGNOSIS — Z923 Personal history of irradiation: Secondary | ICD-10-CM | POA: Diagnosis not present

## 2021-05-30 DIAGNOSIS — Z170 Estrogen receptor positive status [ER+]: Secondary | ICD-10-CM | POA: Insufficient documentation

## 2021-05-30 DIAGNOSIS — Z79811 Long term (current) use of aromatase inhibitors: Secondary | ICD-10-CM | POA: Insufficient documentation

## 2021-05-30 DIAGNOSIS — C50212 Malignant neoplasm of upper-inner quadrant of left female breast: Secondary | ICD-10-CM | POA: Insufficient documentation

## 2021-05-30 NOTE — Progress Notes (Signed)
Radiation Oncology ?Follow up Note ? ?Name: Joanne Collier   ?Date:   05/30/2021 ?MRN:  [id number] ?DOB: [birth year]  ? ? ?This 81 y.o. female presents to the clinic today for 4-year follow-up status post whole breast radiation to her left breast for stage I ER negative PR slightly positive invasive mammary carcinoma. ? ?REFERRING PROVIDER: Derinda Late, MD ? ?HPI: Patient is a 81 year old female now out for years having completed whole breast radiation to her left breast for stage I ER negative PR slightly positive invasive mammary carcinoma seen today in routine follow-up she is doing well.  She specifically denies breast tenderness cough or bone pain..  She is currently on Femara tolerating it well without side effect.  She had mammograms back in December which I have reviewed were BI-RADS 1 negative. ? ?COMPLICATIONS OF TREATMENT: none ? ?FOLLOW UP COMPLIANCE: keeps appointments  ? ?PHYSICAL EXAM:  ?BP (!) 172/84 (BP Location: Right Arm, Patient Position: Sitting)   Temp 98.2 ?F (36.8 ?C) (Tympanic)   Resp 18   Ht '5\' 8"'$  (1.727 m)   Wt 166 lb 14.4 oz (75.7 kg)   BMI 25.38 kg/m?  ?Lungs are clear to A&P cardiac examination essentially unremarkable with regular rate and rhythm. No dominant mass or nodularity is noted in either breast in 2 positions examined. Incision is well-healed. No axillary or supraclavicular adenopathy is appreciated. Cosmetic result is excellent.  Well-developed well-nourished patient in NAD. HEENT reveals PERLA, EOMI, discs not visualized.  Oral cavity is clear. No oral mucosal lesions are identified. Neck is clear without evidence of cervical or supraclavicular adenopathy. Lungs are clear to A&P. Cardiac examination is essentially unremarkable with regular rate and rhythm without murmur rub or thrill. Abdomen is benign with no organomegaly or masses noted. Motor sensory and DTR levels are equal and symmetric in the upper and lower extremities. Cranial nerves II through XII are grossly intact.  Proprioception is intact. No peripheral adenopathy or edema is identified. No motor or sensory levels are noted. Crude visual fields are within normal range. ? ?RADIOLOGY RESULTS: Mammograms reviewed compatible with above-stated findings ? ?PLAN: Present time she is now out over 4 years with no evidence of disease.  I am going to discontinue follow-up care.  Patient knows to call with any concerns at any time.  She continues on Femara without side effect.  Patient has done extremely well. ? ?I would like to take this opportunity to thank you for allowing me to participate in the care of your patient.. ?  ? Noreene Filbert, MD ? ?

## 2021-10-07 ENCOUNTER — Encounter: Payer: Self-pay | Admitting: Internal Medicine

## 2021-10-07 ENCOUNTER — Other Ambulatory Visit: Payer: Self-pay

## 2021-10-07 ENCOUNTER — Inpatient Hospital Stay: Payer: Medicare Other | Admitting: Internal Medicine

## 2021-10-07 ENCOUNTER — Other Ambulatory Visit: Payer: Self-pay | Admitting: Obstetrics and Gynecology

## 2021-10-07 ENCOUNTER — Inpatient Hospital Stay: Payer: Medicare Other | Attending: Internal Medicine

## 2021-10-07 DIAGNOSIS — Z8041 Family history of malignant neoplasm of ovary: Secondary | ICD-10-CM | POA: Diagnosis not present

## 2021-10-07 DIAGNOSIS — I10 Essential (primary) hypertension: Secondary | ICD-10-CM | POA: Insufficient documentation

## 2021-10-07 DIAGNOSIS — Z853 Personal history of malignant neoplasm of breast: Secondary | ICD-10-CM | POA: Diagnosis not present

## 2021-10-07 DIAGNOSIS — C50212 Malignant neoplasm of upper-inner quadrant of left female breast: Secondary | ICD-10-CM | POA: Diagnosis not present

## 2021-10-07 DIAGNOSIS — Z1231 Encounter for screening mammogram for malignant neoplasm of breast: Secondary | ICD-10-CM

## 2021-10-07 DIAGNOSIS — D649 Anemia, unspecified: Secondary | ICD-10-CM | POA: Diagnosis present

## 2021-10-07 DIAGNOSIS — Z170 Estrogen receptor positive status [ER+]: Secondary | ICD-10-CM | POA: Diagnosis not present

## 2021-10-07 DIAGNOSIS — Z79811 Long term (current) use of aromatase inhibitors: Secondary | ICD-10-CM | POA: Diagnosis not present

## 2021-10-07 DIAGNOSIS — Z800 Family history of malignant neoplasm of digestive organs: Secondary | ICD-10-CM | POA: Diagnosis not present

## 2021-10-07 LAB — COMPREHENSIVE METABOLIC PANEL
ALT: 9 U/L (ref 0–44)
AST: 19 U/L (ref 15–41)
Albumin: 3.4 g/dL — ABNORMAL LOW (ref 3.5–5.0)
Alkaline Phosphatase: 87 U/L (ref 38–126)
Anion gap: 6 (ref 5–15)
BUN: 11 mg/dL (ref 8–23)
CO2: 25 mmol/L (ref 22–32)
Calcium: 8.7 mg/dL — ABNORMAL LOW (ref 8.9–10.3)
Chloride: 108 mmol/L (ref 98–111)
Creatinine, Ser: 0.73 mg/dL (ref 0.44–1.00)
GFR, Estimated: >60 mL/min (ref >60)
Glucose, Bld: 143 mg/dL — ABNORMAL HIGH (ref 70–99)
Potassium: 3.4 mmol/L — ABNORMAL LOW (ref 3.5–5.1)
Sodium: 139 mmol/L (ref 135–145)
Total Bilirubin: 0.4 mg/dL (ref 0.3–1.2)
Total Protein: 7.4 g/dL (ref 6.5–8.1)

## 2021-10-07 LAB — CBC WITH DIFFERENTIAL/PLATELET
Abs Immature Granulocytes: 0.02 10*3/uL (ref 0.00–0.07)
Basophils Absolute: 0 10*3/uL (ref 0.0–0.1)
Basophils Relative: 0 %
Eosinophils Absolute: 0.1 10*3/uL (ref 0.0–0.5)
Eosinophils Relative: 1 %
HCT: 33.1 % — ABNORMAL LOW (ref 36.0–46.0)
Hemoglobin: 10.8 g/dL — ABNORMAL LOW (ref 12.0–15.0)
Immature Granulocytes: 0 %
Lymphocytes Relative: 20 %
Lymphs Abs: 1.3 10*3/uL (ref 0.7–4.0)
MCH: 29.8 pg (ref 26.0–34.0)
MCHC: 32.6 g/dL (ref 30.0–36.0)
MCV: 91.2 fL (ref 80.0–100.0)
Monocytes Absolute: 0.3 10*3/uL (ref 0.1–1.0)
Monocytes Relative: 5 %
Neutro Abs: 4.6 10*3/uL (ref 1.7–7.7)
Neutrophils Relative %: 74 %
Platelets: 190 10*3/uL (ref 150–400)
RBC: 3.63 MIL/uL — ABNORMAL LOW (ref 3.87–5.11)
RDW: 13.3 % (ref 11.5–15.5)
WBC: 6.4 10*3/uL (ref 4.0–10.5)
nRBC: 0 % (ref 0.0–0.2)

## 2021-10-07 LAB — IRON AND TIBC
Iron: 48 ug/dL (ref 28–170)
Saturation Ratios: 18 % (ref 10.4–31.8)
TIBC: 267 ug/dL (ref 250–450)
UIBC: 219 ug/dL

## 2021-10-07 LAB — FERRITIN: Ferritin: 142 ng/mL (ref 11–307)

## 2021-10-07 LAB — VITAMIN D 25 HYDROXY (VIT D DEFICIENCY, FRACTURES): Vit D, 25-Hydroxy: 40.22 ng/mL (ref 30–100)

## 2021-10-07 NOTE — Progress Notes (Signed)
No concerns. 

## 2021-10-07 NOTE — Patient Instructions (Signed)
#  Recommend gentle iron 1 pill a day; should not upset your stomach or cause constipation.  Talk to the pharmacist if you can find it/it is over-the-counter.  

## 2021-10-07 NOTE — Assessment & Plan Note (Addendum)
#  Stage I breast cancer-ER negative PR positive HER-2/neu negative breast cancer currently on letrozole [Summer-Fall of 2024].  STABLE; No evidence recurrence.   Bil mammogram [Dr. Schermerhorn]-December 2022-mammogram reviewed WNL.  # Chronic mild anemia chronic hemoglobin 10.8- slightly lower than baseline of 11. [ colonoscopy- 2008; cologuard [Dr.schermerhorn; 2019]-NEG. Check iron studies;ferritin. Recommend PO iron- gentle iron.  # JUNE 2023 [[facility]-] BMD -NORMAL. Calcium 8.8. currently calcium vitamin D. STABLE.  Reviewed the bone density in detail.  Continue calcium plus vitamin D.  # Elevated BP- ?150- 160s; in office; but home better- monitor closely. STABLE.   # DISPOSITION: # check iron studies/ferritin- to labs today # follow up in 8 months [AM appt if possible]- MD; cbc/cmp;-Dr.B

## 2021-10-07 NOTE — Progress Notes (Signed)
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  Stage I status post lumpectomy [Dr. Burnett/Dr.Corcoran] followed by radiation; currently on aromatase inhibitor  #March 2019 bone density normal limits  DIAGNOSIS: BREAST CANCER  STAGE:  I   ;GOALS: cure  CURRENT/MOST RECENT THERAPY : letrozole.     Carcinoma of upper-inner quadrant of left breast in female, estrogen receptor positive ([place])  02/01/2017 Initial Diagnosis   Carcinoma of upper-inner quadrant of left breast in female, estrogen receptor positive ([place])     HISTORY OF PRESENTING ILLNESS: Alone.  Ambulating independently.  Joanne Collier 81 y.o.  female stage I ER PR positive breast cancer currently on aromatase inhibitor is here for follow-up.  Patient denies new problems/concerns today.  Patient denies any worsening joint pains or bone pain.  No headaches.  No shortness breath or cough.  Admits to compliance with the letrozole.  Review of Systems  Constitutional:  Negative for chills, diaphoresis, fever, malaise/fatigue and weight loss.  HENT:  Negative for nosebleeds and sore throat.   Eyes:  Negative for double vision.  Respiratory:  Negative for cough, hemoptysis, sputum production, shortness of breath and wheezing.   Cardiovascular:  Negative for chest pain, palpitations, orthopnea and leg swelling.  Gastrointestinal:  Negative for abdominal pain, blood in stool, constipation, diarrhea, heartburn, melena,  nausea and vomiting.  Genitourinary:  Negative for dysuria, frequency and urgency.  Musculoskeletal:  Positive for back pain and joint pain.  Skin: Negative.  Negative for itching and rash.  Neurological:  Negative for dizziness, tingling, focal weakness, weakness and headaches.  Endo/Heme/Allergies:  Does not bruise/bleed easily.  Psychiatric/Behavioral:  Negative for depression. The patient is not nervous/anxious and does not have insomnia.      MEDICAL HISTORY:  Past Medical History:  Diagnosis Date   Arthritis    LEFT KNEE   Breast cancer ([place]) 2019   left breast   Cancer ([place])    Glaucoma    Hypertension    Personal history of radiation therapy     SURGICAL HISTORY: Past Surgical History:  Procedure Laterality Date   BREAST BIOPSY Left 02/01/2017   INVASIVE MAMMARY CARCINOMA bx   BREAST BIOPSY Left 01/22/2020   [place] bx,  ribbon marker , fibroadenoma   BREAST LUMPECTOMY  02/23/2017   ENDOMETRIAL BIOPSY     EYE SURGERY Bilateral    PARTIAL MASTECTOMY WITH NEEDLE LOCALIZATION Left 02/23/2017   Procedure: PARTIAL MASTECTOMY WITH NEEDLE LOCALIZATION;  Surgeon: Leonie Green, MD;  Location: [facility] ORS;  Service: General;  Laterality: Left;   SENTINEL NODE BIOPSY Left 02/23/2017   Procedure: SENTINEL NODE BIOPSY;  Surgeon: Leonie Green, MD;  Location: [facility];  Service: General;  Laterality: Left;    SOCIAL HISTORY: Social History   Socioeconomic History   Marital status: Married    Spouse name: Not on file   Number of children: Not on file   Years of education: Not on file   Highest education level: Not on file  Occupational History   Not on file  Tobacco Use   Smoking status: Never  Smokeless tobacco: Never  Vaping Use   Vaping Use: Never used  Substance and Sexual Activity   Alcohol use: No   Drug use: No   Sexual activity: Not on file  Other Topics Concern   Not on file  Social History Narrative   Not on file   Social Determinants of  Health   Financial Resource Strain: Not on file  Food Insecurity: Not on file  Transportation Needs: Not on file  Physical Activity: Not on file  Stress: Not on file  Social Connections: Not on file  Intimate Partner Violence: Not on file    FAMILY HISTORY: Family History  Problem Relation Age of Onset   Cancer Mother 46       ovarian   Cancer Brother 69       colon   Breast cancer Neg Hx     ALLERGIES:  has No Known Allergies.  MEDICATIONS:  Current Outpatient Medications  Medication Sig Dispense Refill   aspirin EC 81 MG tablet Take 81 mg by mouth daily.     atenolol (TENORMIN) 25 MG tablet Take 25 mg by mouth every morning.      dorzolamide-timolol (COSOPT) 22.3-6.8 MG/ML ophthalmic solution 1 drop 2 (two) times daily.     ibuprofen (ADVIL,MOTRIN) 200 MG tablet Take 200 mg by mouth every 6 (six) hours as needed for headache or moderate pain.     letrozole (FEMARA) 2.5 MG tablet TAKE 1 TABLET(2.5 MG) BY MOUTH DAILY 90 tablet 3   TRAVATAN Z 0.004 % SOLN ophthalmic solution Place 1 drop into the right eye at bedtime.  1   No current facility-administered medications for this visit.   PHYSICAL EXAMINATION: ECOG PERFORMANCE STATUS: 0 - Asymptomatic  Vitals:   10/07/21 1018  BP: (!) 150/68  Pulse: (!) 53  Temp: (!) 97.5 F (36.4 C)  SpO2: 100%   Filed Weights   10/07/21 1018  Weight: 160 lb 9.6 oz (72.8 kg)    Physical Exam HENT:     Head: Normocephalic and atraumatic.     Mouth/Throat:     Pharynx: No oropharyngeal exudate.  Eyes:     Pupils: Pupils are equal, round, and reactive to light.  Cardiovascular:     Rate and Rhythm: Normal rate and regular rhythm.  Pulmonary:     Effort: Pulmonary effort is normal. No respiratory distress.     Breath sounds: Normal breath sounds. No wheezing.  Abdominal:     General: Bowel sounds are normal. There is no distension.     Palpations: Abdomen is soft. There is no mass.     Tenderness: There is no abdominal  tenderness. There is no guarding or rebound.  Musculoskeletal:        General: No tenderness. Normal range of motion.     Cervical back: Normal range of motion and neck supple.  Skin:    General: Skin is warm.     Comments: Right and left BREAST exam [in the presence of nurse]- no unusual skin changes or dominant masses felt. Surgical scars noted.    Neurological:     Mental Status: She is alert and oriented to person, place, and time.  Psychiatric:        Mood and Affect: Affect normal.    LABORATORY DATA:  I have reviewed the data as listed Lab Results  Component Value Date   WBC 6.4 10/07/2021   HGB 10.8 (L) 10/07/2021   HCT 33.1 (L) 10/07/2021   MCV 91.2 10/07/2021  PLT 190 10/07/2021   Recent Labs    04/06/21 0939 10/07/21 0902  NA 137 139  K 3.8 3.4*  CL 105 108  CO2 26 25  GLUCOSE 135* 143*  BUN 13 11  CREATININE 0.79 0.73  CALCIUM 8.8* 8.7*  GFRNONAA >60 >60  PROT 7.8 7.4  ALBUMIN 3.5 3.4*  AST 17 19  ALT 10 9  ALKPHOS 103 87  BILITOT 0.3 0.4    RADIOGRAPHIC STUDIES: I have personally reviewed the radiological images as listed and agreed with the findings in the report. No results found.  ASSESSMENT & PLAN:   Carcinoma of upper-inner quadrant of left breast in female, estrogen receptor positive ([place]) #Stage I breast cancer-ER negative PR positive HER-2/neu negative breast cancer currently on letrozole [Summer-Fall of 2024].  STABLE; No evidence recurrence.   Bil mammogram [Dr. Schermerhorn]-December 2022-mammogram reviewed WNL.  # Chronic mild anemia chronic hemoglobin 10.8- slightly lower than baseline of 11. [ colonoscopy- 2008; cologuard [Dr.schermerhorn; 2019]-NEG. Check iron studies;ferritin. Recommend PO iron- gentle iron.  # JUNE 2023 [[facility]-] BMD -NORMAL. Calcium 8.8. currently calcium vitamin D. STABLE.  Reviewed the bone density in detail.  # Elevated BP- ?150- 160s; in office; but home better- monitor closely. STABLE.   # DISPOSITION: #  check iron studies/ferritin- to labs today # follow up in 8 months [AM appt if possible]- MD; cbc/cmp;-Dr.B  All questions were answered. The patient knows to call the clinic with any problems, questions or concerns.    Cammie Sickle, MD 10/07/2021 12:24 PM

## 2021-12-27 ENCOUNTER — Other Ambulatory Visit: Payer: Self-pay | Admitting: Internal Medicine

## 2022-01-20 ENCOUNTER — Ambulatory Visit
Admission: RE | Admit: 2022-01-20 | Discharge: 2022-01-20 | Disposition: A | Discharge status: Home / Self Care | Payer: Medicare Other | Source: Ambulatory Visit | Attending: Obstetrics and Gynecology | Admitting: Obstetrics and Gynecology

## 2022-01-20 DIAGNOSIS — Z1231 Encounter for screening mammogram for malignant neoplasm of breast: Secondary | ICD-10-CM

## 2022-06-07 ENCOUNTER — Inpatient Hospital Stay: Payer: Medicare Other

## 2022-06-07 ENCOUNTER — Inpatient Hospital Stay: Payer: Medicare Other | Attending: Internal Medicine | Admitting: Internal Medicine

## 2022-06-07 ENCOUNTER — Encounter: Payer: Self-pay | Admitting: Internal Medicine

## 2022-06-07 VITALS — BP 165/85 | HR 63 | Temp 97.7°F | Ht 68.0 in | Wt 164.4 lb

## 2022-06-07 DIAGNOSIS — Z800 Family history of malignant neoplasm of digestive organs: Secondary | ICD-10-CM | POA: Insufficient documentation

## 2022-06-07 DIAGNOSIS — D649 Anemia, unspecified: Secondary | ICD-10-CM | POA: Diagnosis not present

## 2022-06-07 DIAGNOSIS — Z8041 Family history of malignant neoplasm of ovary: Secondary | ICD-10-CM | POA: Diagnosis not present

## 2022-06-07 DIAGNOSIS — C50212 Malignant neoplasm of upper-inner quadrant of left female breast: Secondary | ICD-10-CM | POA: Diagnosis present

## 2022-06-07 DIAGNOSIS — I10 Essential (primary) hypertension: Secondary | ICD-10-CM | POA: Insufficient documentation

## 2022-06-07 DIAGNOSIS — Z170 Estrogen receptor positive status [ER+]: Secondary | ICD-10-CM

## 2022-06-07 DIAGNOSIS — Z79811 Long term (current) use of aromatase inhibitors: Secondary | ICD-10-CM | POA: Insufficient documentation

## 2022-06-07 LAB — CBC WITH DIFFERENTIAL/PLATELET
Abs Immature Granulocytes: 0.02 10*3/uL (ref 0.00–0.07)
Basophils Absolute: 0 10*3/uL (ref 0.0–0.1)
Basophils Relative: 0 %
Eosinophils Absolute: 0.1 10*3/uL (ref 0.0–0.5)
Eosinophils Relative: 1 %
HCT: 35 % — ABNORMAL LOW (ref 36.0–46.0)
Hemoglobin: 11.5 g/dL — ABNORMAL LOW (ref 12.0–15.0)
Immature Granulocytes: 0 %
Lymphocytes Relative: 22 %
Lymphs Abs: 1.5 10*3/uL (ref 0.7–4.0)
MCH: 30.5 pg (ref 26.0–34.0)
MCHC: 32.9 g/dL (ref 30.0–36.0)
MCV: 92.8 fL (ref 80.0–100.0)
Monocytes Absolute: 0.4 10*3/uL (ref 0.1–1.0)
Monocytes Relative: 5 %
Neutro Abs: 5.1 10*3/uL (ref 1.7–7.7)
Neutrophils Relative %: 72 %
Platelets: 215 10*3/uL (ref 150–400)
RBC: 3.77 MIL/uL — ABNORMAL LOW (ref 3.87–5.11)
RDW: 12.6 % (ref 11.5–15.5)
WBC: 7.2 10*3/uL (ref 4.0–10.5)
nRBC: 0 % (ref 0.0–0.2)

## 2022-06-07 LAB — COMPREHENSIVE METABOLIC PANEL
ALT: 10 U/L (ref 0–44)
AST: 17 U/L (ref 15–41)
Albumin: 3.7 g/dL (ref 3.5–5.0)
Alkaline Phosphatase: 98 U/L (ref 38–126)
Anion gap: 9 (ref 5–15)
BUN: 12 mg/dL (ref 8–23)
CO2: 26 mmol/L (ref 22–32)
Calcium: 9.2 mg/dL (ref 8.9–10.3)
Chloride: 104 mmol/L (ref 98–111)
Creatinine, Ser: 0.82 mg/dL (ref 0.44–1.00)
GFR, Estimated: >60 mL/min (ref >60)
Glucose, Bld: 87 mg/dL (ref 70–99)
Potassium: 3.7 mmol/L (ref 3.5–5.1)
Sodium: 139 mmol/L (ref 135–145)
Total Bilirubin: 0.2 mg/dL — ABNORMAL LOW (ref 0.3–1.2)
Total Protein: 7.8 g/dL (ref 6.5–8.1)

## 2022-06-07 NOTE — Progress Notes (Signed)
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  Stage I status post lumpectomy [Dr. Burnett/Dr.Corcoran] followed by radiation; currently on aromatase inhibitor  Letrozole- STOP [Summer-2024 x5 years   #March 2019 bone density normal limits  DIAGNOSIS: BREAST CANCER  STAGE:  I   ;GOALS: cure  CURRENT/MOST RECENT THERAPY : letrozole.     Carcinoma of upper-inner quadrant of left breast in female, estrogen receptor positive ([facility])  02/01/2017 Initial Diagnosis   Carcinoma of upper-inner quadrant of left breast in female, estrogen receptor positive ([facility])     HISTORY OF PRESENTING ILLNESS: Alone.  Ambulating independently.  Keitha Butte 82 y.o.  female stage I ER PR positive breast cancer currently on aromatase inhibitor is here for follow-up.  Patient denies new problems/concerns today.  Patient denies any worsening joint pains or bone pain.  No headaches.  No shortness breath or cough.  Admits to compliance with the letrozole.  Review of Systems  Constitutional:  Negative for chills, diaphoresis, fever, malaise/fatigue and weight loss.  HENT:  Negative for nosebleeds and sore throat.   Eyes:  Negative for double vision.  Respiratory:  Negative for cough, hemoptysis, sputum production, shortness of breath and wheezing.   Cardiovascular:  Negative for chest pain, palpitations, orthopnea and leg swelling.  Gastrointestinal:  Negative for abdominal pain, blood in stool,  constipation, diarrhea, heartburn, melena, nausea and vomiting.  Genitourinary:  Negative for dysuria, frequency and urgency.  Musculoskeletal:  Positive for back pain and joint pain.  Skin: Negative.  Negative for itching and rash.  Neurological:  Negative for dizziness, tingling, focal weakness, weakness and headaches.  Endo/Heme/Allergies:  Does not bruise/bleed easily.  Psychiatric/Behavioral:  Negative for depression. The patient is not nervous/anxious and does not have insomnia.      MEDICAL HISTORY:  Past Medical History:  Diagnosis Date   Arthritis    LEFT KNEE   Breast cancer ([facility]) 2019   left breast   Cancer ([facility])    Glaucoma    Hypertension    Personal history of radiation therapy     SURGICAL HISTORY: Past Surgical History:  Procedure Laterality Date   BREAST BIOPSY Left 02/01/2017   INVASIVE MAMMARY CARCINOMA bx   BREAST BIOPSY Left 01/22/2020   [place] bx,  ribbon marker , fibroadenoma   BREAST LUMPECTOMY  02/23/2017   ENDOMETRIAL BIOPSY     EYE SURGERY Bilateral    PARTIAL MASTECTOMY WITH NEEDLE LOCALIZATION Left 02/23/2017   Procedure: PARTIAL MASTECTOMY WITH NEEDLE LOCALIZATION;  Surgeon: Nadeen Landau, MD;  Location: [facility];  Service: General;  Laterality: Left;   SENTINEL NODE BIOPSY Left 02/23/2017   Procedure: SENTINEL NODE BIOPSY;  Surgeon: Nadeen Landau, MD;  Location: [facility];  Service: General;  Laterality: Left;    SOCIAL HISTORY: Social History   Socioeconomic History   Marital status: Married    Spouse name: Not on file   Number of children: Not on file   Years of education: Not on file   Highest education level: Not on file  Occupational History   Not on file  Tobacco  Use   Smoking status: Never   Smokeless tobacco: Never  Vaping Use   Vaping Use: Never used  Substance and Sexual Activity   Alcohol use: No   Drug use: No   Sexual activity: Not on file  Other Topics Concern   Not on file  Social History Narrative    Not on file   Social Determinants of Health   Financial Resource Strain: Not on file  Food Insecurity: Not on file  Transportation Needs: Not on file  Physical Activity: Not on file  Stress: Not on file  Social Connections: Not on file  Intimate Partner Violence: Not on file    FAMILY HISTORY: Family History  Problem Relation Age of Onset   Cancer Mother 47       ovarian   Cancer Brother 59       colon   Breast cancer Neg Hx     ALLERGIES:  has No Known Allergies.  MEDICATIONS:  Current Outpatient Medications  Medication Sig Dispense Refill   aspirin EC 81 MG tablet Take 81 mg by mouth daily.     atenolol (TENORMIN) 25 MG tablet Take 25 mg by mouth every morning.      dorzolamide-timolol (COSOPT) 22.3-6.8 MG/ML ophthalmic solution 1 drop 2 (two) times daily.     ibuprofen (ADVIL,MOTRIN) 200 MG tablet Take 200 mg by mouth every 6 (six) hours as needed for headache or moderate pain.     letrozole (FEMARA) 2.5 MG tablet TAKE 1 TABLET(2.5 MG) BY MOUTH DAILY 90 tablet 1   TRAVATAN Z 0.004 % SOLN ophthalmic solution Place 1 drop into the right eye at bedtime.  1   No current facility-administered medications for this visit.   PHYSICAL EXAMINATION: ECOG PERFORMANCE STATUS: 0 - Asymptomatic  Vitals:   06/07/22 0915  BP: (!) 165/85  Pulse: 63  Temp: 97.7 F (36.5 C)  SpO2: 100%   Filed Weights   06/07/22 0915  Weight: 164 lb 6.4 oz (74.6 kg)    Physical Exam HENT:     Head: Normocephalic and atraumatic.     Mouth/Throat:     Pharynx: No oropharyngeal exudate.  Eyes:     Pupils: Pupils are equal, round, and reactive to light.  Cardiovascular:     Rate and Rhythm: Normal rate and regular rhythm.  Pulmonary:     Effort: Pulmonary effort is normal. No respiratory distress.     Breath sounds: Normal breath sounds. No wheezing.  Abdominal:     General: Bowel sounds are normal. There is no distension.     Palpations: Abdomen is soft. There is no mass.      Tenderness: There is no abdominal tenderness. There is no guarding or rebound.  Musculoskeletal:        General: No tenderness. Normal range of motion.     Cervical back: Normal range of motion and neck supple.  Skin:    General: Skin is warm.     Comments: Right and left BREAST exam [in the presence of nurse]- no unusual skin changes or dominant masses felt. Surgical scars noted.    Neurological:     Mental Status: She is alert and oriented to person, place, and time.  Psychiatric:        Mood and Affect: Affect normal.    LABORATORY DATA:  I have reviewed the data as listed Lab Results  Component Value Date   WBC 7.2 06/07/2022   HGB 11.5 (L) 06/07/2022   HCT 35.0 (L)  06/07/2022   MCV 92.8 06/07/2022   PLT 215 06/07/2022   Recent Labs    10/07/21 0902 06/07/22 0913  NA 139 139  K 3.4* 3.7  CL 108 104  CO2 25 26  GLUCOSE 143* 87  BUN 11 12  CREATININE 0.73 0.82  CALCIUM 8.7* 9.2  GFRNONAA >60 >60  PROT 7.4 7.8  ALBUMIN 3.4* 3.7  AST 19 17  ALT 9 10  ALKPHOS 87 98  BILITOT 0.4 0.2*    RADIOGRAPHIC STUDIES: I have personally reviewed the radiological images as listed and agreed with the findings in the report. No results found.  ASSESSMENT & PLAN:   Carcinoma of upper-inner quadrant of left breast in female, estrogen receptor positive ([facility]) #[2019 ]Stage I breast cancer-ER negative PR positive HER-2/neu negative breast cancer. Bil mammogram [Dr. Schermerhorn]-December 2023-mammogram reviewed WNL.  # currently on letrozole [Summer-Fall of 2024]  Stable. No evidence recurrence.  Discussed that in general the duration of aromatase inhibitor and her clinical situation will be about 5 years.  Patient feels comfortable with the plan to discontinue letrozole at this time.  Will discontinue Letrozole May 2024; will not refills.   # Chronic mild anemia chronic-  11. [ colonoscopy- 2008; cologuard [Dr.schermerhorn; 2019]-NEG. 2023- iron sat 18;ferritin-170. Stable-  continue PO iron- gentle iron.  # JUNE 2023 [[facility]-] BMD -NORMAL. Calcium 8.8. currently calcium vitamin D  Reviewed the bone density in detail.  Continue calcium plus vitamin D.Stable.    # Elevated BP- ?150- 160s; in office; but home better- monitor closely. Stable.   # DISPOSITION: # follow up in 12  months [AM appt if possible]- MD; cbc/cmp;-Dr.B  # 25 minutes face-to-face with the patient discussing the above plan of care; more than 50% of time spent on prognosis/ natural history; counseling and coordination.   All questions were answered. The patient knows to call the clinic with any problems, questions or concerns.    Earna Coder, MD 06/07/2022 10:09 AM

## 2022-06-07 NOTE — Progress Notes (Signed)
No concerns today 

## 2022-06-07 NOTE — Assessment & Plan Note (Addendum)
#[  2019 ]Stage I breast cancer-ER negative PR positive HER-2/neu negative breast cancer. Bil mammogram [Dr. Schermerhorn]-December 2023-mammogram reviewed WNL.  # currently on letrozole [Summer-Fall of 2024 x5 years ]  Stable. No evidence recurrence.  Discussed that in general the duration of aromatase inhibitor and her clinical situation will be about 5 years.  Patient feels comfortable with the plan to discontinue letrozole at this time.  Will discontinue Letrozole May 2024; will not refills.   # Chronic mild anemia chronic-  11. [ colonoscopy- 2008; cologuard [Dr.schermerhorn; 2019]-NEG. 2023- iron sat 18;ferritin-170. Stable- continue PO iron- gentle iron.  # JUNE 2023 [[facility]-] BMD -NORMAL. Calcium 8.8. currently calcium vitamin D  Reviewed the bone density in detail.  Continue calcium plus vitamin D.Stable.    # Elevated BP- ?150- 160s; in office; but home better- monitor closely. Stable.   # DISPOSITION: # follow up in 12  months [AM appt if possible]- MD; cbc/cmp;-Dr.B  # 25 minutes face-to-face with the patient discussing the above plan of care; more than 50% of time spent on prognosis/ natural history; counseling and coordination.

## 2023-01-22 ENCOUNTER — Ambulatory Visit
Admission: RE | Admit: 2023-01-22 | Discharge: 2023-01-22 | Disposition: A | Discharge status: Home / Self Care | Payer: Medicare Other | Source: Ambulatory Visit | Attending: Internal Medicine | Admitting: Internal Medicine

## 2023-01-22 DIAGNOSIS — Z1231 Encounter for screening mammogram for malignant neoplasm of breast: Secondary | ICD-10-CM | POA: Diagnosis not present

## 2023-01-22 DIAGNOSIS — C50212 Malignant neoplasm of upper-inner quadrant of left female breast: Secondary | ICD-10-CM | POA: Diagnosis present

## 2023-01-22 DIAGNOSIS — Z170 Estrogen receptor positive status [ER+]: Secondary | ICD-10-CM | POA: Diagnosis present

## 2023-06-07 ENCOUNTER — Inpatient Hospital Stay: Payer: Medicare Other | Admitting: Internal Medicine

## 2023-06-07 ENCOUNTER — Encounter: Payer: Self-pay | Admitting: Internal Medicine

## 2023-06-07 ENCOUNTER — Inpatient Hospital Stay: Payer: Medicare Other | Attending: Internal Medicine

## 2023-06-07 DIAGNOSIS — Z8041 Family history of malignant neoplasm of ovary: Secondary | ICD-10-CM | POA: Insufficient documentation

## 2023-06-07 DIAGNOSIS — D649 Anemia, unspecified: Secondary | ICD-10-CM | POA: Diagnosis not present

## 2023-06-07 DIAGNOSIS — C50212 Malignant neoplasm of upper-inner quadrant of left female breast: Secondary | ICD-10-CM

## 2023-06-07 DIAGNOSIS — Z853 Personal history of malignant neoplasm of breast: Secondary | ICD-10-CM | POA: Diagnosis present

## 2023-06-07 DIAGNOSIS — Z923 Personal history of irradiation: Secondary | ICD-10-CM | POA: Insufficient documentation

## 2023-06-07 DIAGNOSIS — Z170 Estrogen receptor positive status [ER+]: Secondary | ICD-10-CM | POA: Diagnosis not present

## 2023-06-07 DIAGNOSIS — R030 Elevated blood-pressure reading, without diagnosis of hypertension: Secondary | ICD-10-CM | POA: Insufficient documentation

## 2023-06-07 DIAGNOSIS — Z800 Family history of malignant neoplasm of digestive organs: Secondary | ICD-10-CM | POA: Insufficient documentation

## 2023-06-07 LAB — CMP (CANCER CENTER ONLY)
ALT: 11 U/L (ref 0–44)
AST: 18 U/L (ref 15–41)
Albumin: 3.6 g/dL (ref 3.5–5.0)
Alkaline Phosphatase: 89 U/L (ref 38–126)
Anion gap: 11 (ref 5–15)
BUN: 14 mg/dL (ref 8–23)
CO2: 23 mmol/L (ref 22–32)
Calcium: 8.8 mg/dL — ABNORMAL LOW (ref 8.9–10.3)
Chloride: 106 mmol/L (ref 98–111)
Creatinine: 0.91 mg/dL (ref 0.44–1.00)
GFR, Estimated: >60 mL/min (ref >60)
Glucose, Bld: 106 mg/dL — ABNORMAL HIGH (ref 70–99)
Potassium: 3.6 mmol/L (ref 3.5–5.1)
Sodium: 140 mmol/L (ref 135–145)
Total Bilirubin: 0.5 mg/dL (ref 0.0–1.2)
Total Protein: 7.6 g/dL (ref 6.5–8.1)

## 2023-06-07 LAB — CBC WITH DIFFERENTIAL (CANCER CENTER ONLY)
Abs Immature Granulocytes: 0.03 10*3/uL (ref 0.00–0.07)
Basophils Absolute: 0.1 10*3/uL (ref 0.0–0.1)
Basophils Relative: 1 %
Eosinophils Absolute: 0.1 10*3/uL (ref 0.0–0.5)
Eosinophils Relative: 1 %
HCT: 34.4 % — ABNORMAL LOW (ref 36.0–46.0)
Hemoglobin: 11.3 g/dL — ABNORMAL LOW (ref 12.0–15.0)
Immature Granulocytes: 0 %
Lymphocytes Relative: 24 %
Lymphs Abs: 1.8 10*3/uL (ref 0.7–4.0)
MCH: 29.7 pg (ref 26.0–34.0)
MCHC: 32.8 g/dL (ref 30.0–36.0)
MCV: 90.5 fL (ref 80.0–100.0)
Monocytes Absolute: 0.4 10*3/uL (ref 0.1–1.0)
Monocytes Relative: 6 %
Neutro Abs: 5.1 10*3/uL (ref 1.7–7.7)
Neutrophils Relative %: 68 %
Platelet Count: 219 10*3/uL (ref 150–400)
RBC: 3.8 MIL/uL — ABNORMAL LOW (ref 3.87–5.11)
RDW: 12.8 % (ref 11.5–15.5)
WBC Count: 7.6 10*3/uL (ref 4.0–10.5)
nRBC: 0 % (ref 0.0–0.2)

## 2023-06-07 NOTE — Progress Notes (Signed)
 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  Stage I status post lumpectomy [Dr. Burnett/Dr.Corcoran] followed by radiation; currently on aromatase inhibitor  Letrozole - STOP [Summer-2024 x5 years   #March 2019 bone density normal limits  DIAGNOSIS: BREAST CANCER  STAGE:  I   ;GOALS: cure      Carcinoma of upper-inner quadrant of left breast in female, estrogen receptor positive (HCC)  02/01/2017 Initial Diagnosis   Carcinoma of upper-inner quadrant of left breast in female, estrogen receptor positive (HCC)     HISTORY OF PRESENTING ILLNESS: Alone.  Ambulating independently.  Joanne Collier 83 y.o.  female stage I ER PR positive breast cancer currently s/p  aromatase inhibitor [finished Fall, 2024] is here for follow-up/and review results of the mammogram.   Patient denies new problems/concerns today.  Patient denies any worsening joint pains or bone pain.  No headaches.  No shortness breath or cough.   Review of Systems  Constitutional:  Negative for chills, diaphoresis, fever, malaise/fatigue and weight loss.  HENT:  Negative for nosebleeds and sore throat.   Eyes:  Negative for double vision.  Respiratory:  Negative for cough, hemoptysis, sputum production, shortness of breath and wheezing.   Cardiovascular:  Negative for chest pain, palpitations, orthopnea and leg swelling.  Gastrointestinal:  Negative for abdominal pain, blood in stool, constipation,  diarrhea, heartburn, melena, nausea and vomiting.  Genitourinary:  Negative for dysuria, frequency and urgency.  Musculoskeletal:  Positive for back pain and joint pain.  Skin: Negative.  Negative for itching and rash.  Neurological:  Negative for dizziness, tingling, focal weakness, weakness and headaches.  Endo/Heme/Allergies:  Does not bruise/bleed easily.  Psychiatric/Behavioral:  Negative for depression. The patient is not nervous/anxious and does not have insomnia.      MEDICAL HISTORY:  Past Medical History:  Diagnosis Date   Arthritis    LEFT KNEE   Breast cancer (HCC) 2019   left breast   Cancer (HCC)    Glaucoma    Hypertension    Personal history of radiation therapy     SURGICAL HISTORY: Past Surgical History:  Procedure Laterality Date   BREAST BIOPSY Left 02/01/2017   INVASIVE MAMMARY CARCINOMA bx   BREAST BIOPSY Left 01/22/2020   us  bx,  ribbon marker , fibroadenoma   BREAST LUMPECTOMY  02/23/2017   ENDOMETRIAL BIOPSY     EYE SURGERY Bilateral    PARTIAL MASTECTOMY WITH NEEDLE LOCALIZATION Left 02/23/2017   Procedure: PARTIAL MASTECTOMY WITH NEEDLE LOCALIZATION;  Surgeon: Benancio Bracket, MD;  Location: [facility];  Service: General;  Laterality: Left;   SENTINEL NODE BIOPSY Left 02/23/2017   Procedure: SENTINEL NODE BIOPSY;  Surgeon: Benancio Bracket, MD;  Location: [facility];  Service: General;  Laterality: Left;    SOCIAL HISTORY: Social History   Socioeconomic History   Marital status: Married    Spouse name: Not on file   Number of children: Not on file   Years of education: Not on file   Highest education level: Not on file  Occupational History   Not on file  Tobacco Use  Smoking status: Never   Smokeless tobacco: Never  Vaping Use   Vaping status: Never Used  Substance and Sexual Activity   Alcohol use: No   Drug use: No   Sexual activity: Not on file  Other Topics Concern   Not on file  Social History Narrative   Not on file    Social Drivers of Health   Financial Resource Strain: Low Risk  (03/29/2023)   Received from [facility]   Overall Financial Resource Strain (CARDIA)    Difficulty of Paying Living Expenses: Not very hard  Food Insecurity: No Food Insecurity (03/29/2023)   Received from [facility]   Hunger Vital Sign    Worried About Running Out of Food in the Last Year: Never true    Ran Out of Food in the Last Year: Never true  Transportation Needs: No Transportation Needs (03/29/2023)   Received from [facility] - Transportation    In the past 12 months, has lack of transportation kept you from medical appointments or from getting medications?: No    Lack of Transportation (Non-Medical): No  Physical Activity: Not on file  Stress: Not on file  Social Connections: Not on file  Intimate Partner Violence: Not on file    FAMILY HISTORY: Family History  Problem Relation Age of Onset   Cancer Mother 76       ovarian   Cancer Brother 22       colon   Breast cancer Neg Hx     ALLERGIES:  has no known allergies.  MEDICATIONS:  Current Outpatient Medications  Medication Sig Dispense Refill   aspirin EC 81 MG tablet Take 81 mg by mouth daily.     atenolol  (TENORMIN ) 25 MG tablet Take 25 mg by mouth every morning.      dorzolamide -timolol  (COSOPT ) 22.3-6.8 MG/ML ophthalmic solution 1 drop 2 (two) times daily.     ibuprofen (ADVIL,MOTRIN) 200 MG tablet Take 200 mg by mouth every 6 (six) hours as needed for headache or moderate pain.     letrozole  (FEMARA ) 2.5 MG tablet TAKE 1 TABLET(2.5 MG) BY MOUTH DAILY 90 tablet 1   TRAVATAN  Z 0.004 % SOLN ophthalmic solution Place 1 drop into the right eye at bedtime.  1   VYZULTA 0.024 % SOLN Apply 1 drop to eye at bedtime.     No current facility-administered medications for this visit.   PHYSICAL EXAMINATION: ECOG PERFORMANCE STATUS: 0 - Asymptomatic  Vitals:   06/07/23 0919  BP: (!)  118/51  Pulse: 60  Resp: 18  Temp: 98 F (36.7 C)  SpO2: 100%   Filed Weights   06/07/23 0919  Weight: 161 lb 14.4 oz (73.4 kg)    Physical Exam HENT:     Head: Normocephalic and atraumatic.     Mouth/Throat:     Pharynx: No oropharyngeal exudate.  Eyes:     Pupils: Pupils are equal, round, and reactive to light.  Cardiovascular:     Rate and Rhythm: Normal rate and regular rhythm.  Pulmonary:     Effort: Pulmonary effort is normal. No respiratory distress.     Breath sounds: Normal breath sounds. No wheezing.  Abdominal:     General: Bowel sounds are normal. There is no distension.     Palpations: Abdomen is soft. There is no mass.     Tenderness: There is no abdominal tenderness. There is no guarding or rebound.  Musculoskeletal:  General: No tenderness. Normal range of motion.     Cervical back: Normal range of motion and neck supple.  Skin:    General: Skin is warm.     Comments: Right and left BREAST exam [in the presence of nurse]- no unusual skin changes or dominant masses felt. Surgical scars noted.    Neurological:     Mental Status: She is alert and oriented to person, place, and time.  Psychiatric:        Mood and Affect: Affect normal.    LABORATORY DATA:  I have reviewed the data as listed Lab Results  Component Value Date   WBC 7.6 06/07/2023   HGB 11.3 (L) 06/07/2023   HCT 34.4 (L) 06/07/2023   MCV 90.5 06/07/2023   PLT 219 06/07/2023   Recent Labs    06/07/23 0903  NA 140  K 3.6  CL 106  CO2 23  GLUCOSE 106*  BUN 14  CREATININE 0.91  CALCIUM 8.8*  GFRNONAA >60  PROT 7.6  ALBUMIN 3.6  AST 18  ALT 11  ALKPHOS 89  BILITOT 0.5    RADIOGRAPHIC STUDIES: I have personally reviewed the radiological images as listed and agreed with the findings in the report. No results found.  ASSESSMENT & PLAN:   Carcinoma of upper-inner quadrant of left breast in female, estrogen receptor positive (HCC) #[2019 ]Stage I breast cancer-ER  negative PR positive HER-2/neu negative breast cancer. Bil mammogram [Dr. Schermerhorn]-December 2024-mammogram reviewed WNL. # currently s/p letrozole  [Summer-Fall of 2024 x5 years ]   stable.   # Chronic mild anemia chronic-  11. [ colonoscopy- 2008; cologuard [Dr.schermerhorn; 2019]-NEG. 2023- iron sat 18;ferritin-170. Stable- continue PO iron- gentle iron.  # JUNE 2023 [[facility]-] BMD -NORMAL. Calcium 8.8. currently calcium vitamin D   Reviewed the bone density in detail.  Continue calcium plus vitamin D .Stable.    # Elevated BP- ?150- 160s; in office; but home better- monitor closely. Stable.   #Since patient is clinically stable I think is reasonable for the patient to follow-up with PCP/can follow-up with us  as needed.  Patient comfortable with the plan; to call us  if any questions or concerns in the interim.  # DISPOSITION: # follow up as needed-;-Dr.B     All questions were answered. The patient knows to call the clinic with any problems, questions or concerns.    Gwyn Leos, MD 06/07/2023 10:08 AM

## 2023-06-07 NOTE — Assessment & Plan Note (Addendum)
#[  2019 ]Stage I breast cancer-ER negative PR positive HER-2/neu negative breast cancer. Bil mammogram [Dr. Schermerhorn]-December 2024-mammogram reviewed WNL. # currently s/p letrozole  [Summer-Fall of 2024 x5 years ]   stable.   # Chronic mild anemia chronic-  11. [ colonoscopy- 2008; cologuard [Dr.schermerhorn; 2019]-NEG. 2023- iron sat 18;ferritin-170. Stable- continue PO iron- gentle iron.  # JUNE 2023 [[facility]-] BMD -NORMAL. Calcium 8.8. currently calcium vitamin D   Reviewed the bone density in detail.  Continue calcium plus vitamin D .Stable.    # Elevated BP- ?150- 160s; in office; but home better- monitor closely. Stable.   #Since patient is clinically stable I think is reasonable for the patient to follow-up with PCP/can follow-up with us  as needed.  Patient comfortable with the plan; to call us  if any questions or concerns in the interim.  # DISPOSITION: # follow up as needed-;-Dr.B

## 2023-08-07 ENCOUNTER — Other Ambulatory Visit: Payer: Self-pay | Admitting: Family Medicine

## 2023-08-07 DIAGNOSIS — Z1231 Encounter for screening mammogram for malignant neoplasm of breast: Secondary | ICD-10-CM

## 2024-01-25 ENCOUNTER — Ambulatory Visit
Admission: RE | Admit: 2024-01-25 | Discharge: 2024-01-25 | Disposition: A | Discharge status: Home / Self Care | Source: Ambulatory Visit | Attending: Family Medicine | Admitting: Family Medicine

## 2024-01-25 DIAGNOSIS — Z1231 Encounter for screening mammogram for malignant neoplasm of breast: Secondary | ICD-10-CM | POA: Insufficient documentation
